# Patient Record
Sex: Female | Born: 1975 | Race: Black or African American | Hispanic: No | Smoking: Never smoker
Health system: Southern US, Community
[De-identification: ages and names within clinical notes are randomized; demographics above are authoritative.]

## PROBLEM LIST (undated history)

## (undated) HISTORY — PX: ANKLE SURGERY: SHX546

## (undated) HISTORY — PX: WISDOM TOOTH EXTRACTION: SHX21

## (undated) HISTORY — PX: FRACTURE SURGERY: SHX138

---

## 2002-11-19 HISTORY — PX: DILATION AND CURETTAGE OF UTERUS: SHX78

## 2005-06-20 ENCOUNTER — Emergency Department: Payer: Self-pay | Admitting: Emergency Medicine

## 2005-07-17 ENCOUNTER — Emergency Department: Payer: Self-pay | Admitting: Emergency Medicine

## 2007-02-23 ENCOUNTER — Emergency Department: Payer: Self-pay | Admitting: General Practice

## 2007-03-26 ENCOUNTER — Emergency Department: Payer: Self-pay | Admitting: Emergency Medicine

## 2007-08-21 ENCOUNTER — Emergency Department: Payer: Self-pay | Admitting: Emergency Medicine

## 2007-12-10 ENCOUNTER — Emergency Department: Payer: Self-pay | Admitting: Emergency Medicine

## 2008-02-10 ENCOUNTER — Emergency Department: Payer: Self-pay | Admitting: Internal Medicine

## 2008-08-17 ENCOUNTER — Other Ambulatory Visit: Payer: Self-pay

## 2008-08-17 ENCOUNTER — Emergency Department: Payer: Self-pay | Admitting: Emergency Medicine

## 2008-09-28 ENCOUNTER — Emergency Department: Payer: Self-pay | Admitting: Emergency Medicine

## 2010-02-13 ENCOUNTER — Emergency Department: Payer: Self-pay | Admitting: Internal Medicine

## 2010-06-16 ENCOUNTER — Emergency Department: Payer: Self-pay | Admitting: Emergency Medicine

## 2010-08-10 ENCOUNTER — Emergency Department: Payer: Self-pay | Admitting: Emergency Medicine

## 2010-10-29 ENCOUNTER — Emergency Department: Payer: Self-pay | Admitting: Unknown Physician Specialty

## 2010-11-02 ENCOUNTER — Emergency Department: Payer: Self-pay | Admitting: Unknown Physician Specialty

## 2010-11-12 ENCOUNTER — Emergency Department: Payer: Self-pay | Admitting: Emergency Medicine

## 2010-11-21 ENCOUNTER — Emergency Department: Payer: Self-pay | Admitting: Emergency Medicine

## 2010-12-26 ENCOUNTER — Emergency Department: Payer: Self-pay | Admitting: Internal Medicine

## 2010-12-27 ENCOUNTER — Emergency Department: Payer: Self-pay | Admitting: Emergency Medicine

## 2011-05-26 ENCOUNTER — Emergency Department: Payer: Self-pay | Admitting: Emergency Medicine

## 2012-04-06 ENCOUNTER — Emergency Department: Payer: Self-pay | Admitting: Emergency Medicine

## 2012-04-06 LAB — URINALYSIS, COMPLETE
Bacteria: NONE SEEN
Bilirubin,UR: NEGATIVE
Blood: NEGATIVE
Glucose,UR: NEGATIVE mg/dL (ref 0–75)
Ketone: NEGATIVE
Nitrite: NEGATIVE
Ph: 5 (ref 4.5–8.0)
Protein: NEGATIVE
RBC,UR: 4 /HPF (ref 0–5)
Specific Gravity: 1.023 (ref 1.003–1.030)
Squamous Epithelial: 5
WBC UR: 5 /HPF (ref 0–5)

## 2012-04-24 ENCOUNTER — Emergency Department: Payer: Self-pay | Admitting: *Deleted

## 2012-04-25 LAB — URINALYSIS, COMPLETE
Blood: NEGATIVE
Glucose,UR: NEGATIVE mg/dL (ref 0–75)
Nitrite: NEGATIVE
Protein: NEGATIVE
Specific Gravity: 1.024 (ref 1.003–1.030)
Squamous Epithelial: 23

## 2012-04-26 LAB — URINE CULTURE

## 2012-05-02 ENCOUNTER — Emergency Department: Payer: Self-pay | Admitting: Emergency Medicine

## 2012-05-03 LAB — URINALYSIS, COMPLETE
Blood: NEGATIVE
Nitrite: NEGATIVE
Ph: 5 (ref 4.5–8.0)
Protein: NEGATIVE
RBC,UR: 7 /HPF (ref 0–5)

## 2012-05-03 LAB — PREGNANCY, URINE: Pregnancy Test, Urine: NEGATIVE m[IU]/mL

## 2013-05-15 ENCOUNTER — Emergency Department: Payer: Self-pay | Admitting: Emergency Medicine

## 2013-05-15 LAB — COMPREHENSIVE METABOLIC PANEL
Albumin: 3.5 g/dL (ref 3.4–5.0)
Alkaline Phosphatase: 68 U/L (ref 50–136)
Anion Gap: 5 — ABNORMAL LOW (ref 7–16)
BUN: 10 mg/dL (ref 7–18)
Chloride: 106 mmol/L (ref 98–107)
Creatinine: 1.14 mg/dL (ref 0.60–1.30)
EGFR (African American): >60
Glucose: 70 mg/dL (ref 65–99)
Osmolality: 275 (ref 275–301)
Potassium: 3.9 mmol/L (ref 3.5–5.1)
SGOT(AST): 13 U/L — ABNORMAL LOW (ref 15–37)
SGPT (ALT): 18 U/L (ref 12–78)
Sodium: 139 mmol/L (ref 136–145)

## 2013-05-15 LAB — CBC
HCT: 40 % (ref 35.0–47.0)
MCH: 30 pg (ref 26.0–34.0)
MCV: 90 fL (ref 80–100)
Platelet: 264 10*3/uL (ref 150–440)
RDW: 12.9 % (ref 11.5–14.5)
WBC: 16 10*3/uL — ABNORMAL HIGH (ref 3.6–11.0)

## 2013-05-15 LAB — URINALYSIS, COMPLETE
Glucose,UR: NEGATIVE mg/dL (ref 0–75)
Nitrite: NEGATIVE
Ph: 7 (ref 4.5–8.0)
Squamous Epithelial: 2
WBC UR: 41 /HPF (ref 0–5)

## 2013-05-18 LAB — BETA STREP CULTURE(ARMC)

## 2013-06-25 ENCOUNTER — Emergency Department: Payer: Self-pay | Admitting: Emergency Medicine

## 2013-06-25 LAB — COMPREHENSIVE METABOLIC PANEL
Anion Gap: 5 — ABNORMAL LOW (ref 7–16)
Bilirubin,Total: 0.2 mg/dL (ref 0.2–1.0)
Calcium, Total: 9.4 mg/dL (ref 8.5–10.1)
EGFR (African American): >60
Glucose: 80 mg/dL (ref 65–99)
Osmolality: 277 (ref 275–301)
SGOT(AST): 18 U/L (ref 15–37)
SGPT (ALT): 17 U/L (ref 12–78)
Sodium: 139 mmol/L (ref 136–145)
Total Protein: 8.3 g/dL — ABNORMAL HIGH (ref 6.4–8.2)

## 2013-06-25 LAB — CBC
HCT: 39.4 % (ref 35.0–47.0)
MCV: 88 fL (ref 80–100)
RBC: 4.48 10*6/uL (ref 3.80–5.20)
WBC: 11.7 10*3/uL — ABNORMAL HIGH (ref 3.6–11.0)

## 2013-06-26 LAB — URINALYSIS, COMPLETE
Bilirubin,UR: NEGATIVE
Ketone: NEGATIVE
Ph: 5 (ref 4.5–8.0)
Protein: 30

## 2013-09-14 ENCOUNTER — Emergency Department: Payer: Self-pay | Admitting: Emergency Medicine

## 2013-09-14 LAB — URINALYSIS, COMPLETE
Glucose,UR: NEGATIVE mg/dL (ref 0–75)
Ph: 5 (ref 4.5–8.0)
Protein: 30
RBC,UR: 102 /HPF (ref 0–5)
Specific Gravity: 1.015 (ref 1.003–1.030)
Squamous Epithelial: 1
WBC UR: 1039 /HPF (ref 0–5)

## 2014-09-24 ENCOUNTER — Emergency Department: Payer: Self-pay | Admitting: Internal Medicine

## 2014-09-24 LAB — CBC WITH DIFFERENTIAL/PLATELET
Basophil #: 0.2 10*3/uL — ABNORMAL HIGH (ref 0.0–0.1)
Basophil %: 0.8 %
EOS PCT: 0.2 %
Eosinophil #: 0 10*3/uL (ref 0.0–0.7)
HCT: 43.3 % (ref 35.0–47.0)
HGB: 14.1 g/dL (ref 12.0–16.0)
LYMPHS ABS: 2.1 10*3/uL (ref 1.0–3.6)
Lymphocyte %: 11.6 %
MCH: 29.4 pg (ref 26.0–34.0)
MCHC: 32.6 g/dL (ref 32.0–36.0)
MCV: 90 fL (ref 80–100)
Monocyte #: 1.2 x10 3/mm — ABNORMAL HIGH (ref 0.2–0.9)
Monocyte %: 6.6 %
Neutrophil #: 14.9 10*3/uL — ABNORMAL HIGH (ref 1.4–6.5)
Neutrophil %: 80.8 %
Platelet: 265 10*3/uL (ref 150–440)
RBC: 4.8 10*6/uL (ref 3.80–5.20)
RDW: 13.6 % (ref 11.5–14.5)
WBC: 18.4 10*3/uL — ABNORMAL HIGH (ref 3.6–11.0)

## 2014-09-24 LAB — URINALYSIS, COMPLETE
Bilirubin,UR: NEGATIVE
Blood: NEGATIVE
GLUCOSE, UR: NEGATIVE mg/dL (ref 0–75)
Ketone: NEGATIVE
LEUKOCYTE ESTERASE: NEGATIVE
NITRITE: NEGATIVE
PROTEIN: NEGATIVE
Ph: 7 (ref 4.5–8.0)
RBC,UR: 1 /HPF (ref 0–5)
SPECIFIC GRAVITY: 1.012 (ref 1.003–1.030)
Squamous Epithelial: 5
WBC UR: 4 /HPF (ref 0–5)

## 2014-09-24 LAB — ED INFLUENZA
H1N1 flu by pcr: NOT DETECTED
Influenza A By PCR: NEGATIVE
Influenza B By PCR: NEGATIVE

## 2014-09-24 LAB — BASIC METABOLIC PANEL
Anion Gap: 6 — ABNORMAL LOW (ref 7–16)
BUN: 10 mg/dL (ref 7–18)
CO2: 26 mmol/L (ref 21–32)
Calcium, Total: 8.8 mg/dL (ref 8.5–10.1)
Chloride: 105 mmol/L (ref 98–107)
Creatinine: 0.94 mg/dL (ref 0.60–1.30)
EGFR (African American): >60
EGFR (Non-African Amer.): >60
Glucose: 75 mg/dL (ref 65–99)
Osmolality: 272 (ref 275–301)
Potassium: 4.6 mmol/L (ref 3.5–5.1)
Sodium: 137 mmol/L (ref 136–145)

## 2014-09-24 LAB — MONONUCLEOSIS SCREEN: Mono Test: NEGATIVE

## 2014-09-27 LAB — BETA STREP CULTURE(ARMC)

## 2014-11-18 ENCOUNTER — Emergency Department: Payer: Self-pay | Admitting: Emergency Medicine

## 2015-04-02 ENCOUNTER — Encounter: Payer: Self-pay | Admitting: Emergency Medicine

## 2015-04-02 ENCOUNTER — Emergency Department
Admission: EM | Admit: 2015-04-02 | Discharge: 2015-04-02 | Disposition: A | Discharge status: Home / Self Care | Payer: BLUE CROSS/BLUE SHIELD | Attending: Emergency Medicine | Admitting: Emergency Medicine

## 2015-04-02 DIAGNOSIS — R059 Cough, unspecified: Secondary | ICD-10-CM

## 2015-04-02 DIAGNOSIS — R05 Cough: Secondary | ICD-10-CM

## 2015-04-02 DIAGNOSIS — J0111 Acute recurrent frontal sinusitis: Secondary | ICD-10-CM | POA: Diagnosis not present

## 2015-04-02 DIAGNOSIS — R52 Pain, unspecified: Secondary | ICD-10-CM | POA: Diagnosis present

## 2015-04-02 MED ORDER — AMOXICILLIN-POT CLAVULANATE 875-125 MG PO TABS: 1.0000 | ORAL_TABLET | Freq: Two times a day (BID) | ORAL | Status: AC

## 2015-04-02 MED ORDER — IBUPROFEN 800 MG PO TABS: 800.0000 mg | ORAL_TABLET | Freq: Three times a day (TID) | ORAL | Status: DC | PRN

## 2015-04-02 MED ORDER — FLUCONAZOLE 150 MG PO TABS: 150.0000 mg | ORAL_TABLET | Freq: Every day | ORAL | Status: DC

## 2015-04-02 MED ORDER — LORATADINE-PSEUDOEPHEDRINE ER 5-120 MG PO TB12
1.0000 | ORAL_TABLET | Freq: Two times a day (BID) | ORAL | Status: DC
Start: 2015-04-02 — End: 2015-08-18

## 2015-04-02 MED ORDER — OXYMETAZOLINE HCL 0.05 % NA SOLN: 2.0000 | Freq: Two times a day (BID) | NASAL | Status: AC

## 2015-04-02 NOTE — ED Notes (Signed)
D/C instructions given and reviewed. Pt verbalized an understanding and has no additional questions or concerns at this time. Pt ambulating independently.

## 2015-04-02 NOTE — ED Notes (Signed)
Patient to ED with c/o generalized cold symptoms along with body aches and chills for about the last 2 weeks that are continuing to get worse.

## 2015-04-02 NOTE — ED Provider Notes (Signed)
CSN: [id number]     Arrival date & time 04/02/15  1532 History   None    Chief Complaint  Patient presents with  . URI  . Generalized Body Aches  . Chills     (Consider location/radiation/quality/duration/timing/severity/associated sxs/prior Treatment) HPI patient has a two-week worsening history of sinusitis with a history of the same said she's tried over-the-counter treatments with no success is here today for reevaluation rates it as a 8 out of 10 pressure-like pain around her eyes with drainage going down into her throat causing her to cough a lot nothing seemingly making it particularly better or worse she says she can feel the pressure increases when she leans forward denies any nausea vomiting or any other symptoms of note  History reviewed. No pertinent past medical history. History reviewed. No pertinent past surgical history. History reviewed. No pertinent family history. History  Substance Use Topics  . Smoking status: Never Smoker   . Smokeless tobacco: Never Used  . Alcohol Use: No   OB History    No data available     Review of Systems   Constitutional: No fever.  Baseline level of activity. Eyes: No visual changes.  No red eyes/discharge. ENT: No sore throat.  Not pulling at ears. Cardiovascular: Negative for chest pain/palpitations. Respiratory: Negative for shortness of breath. Gastrointestinal: No abdominal pain.  No nausea, no vomiting.  No diarrhea.  No constipation. Genitourinary: Negative for dysuria.  Normal urination. Musculoskeletal: Negative for back pain. Skin: Negative for rash. Neurological: Negative for headaches, focal weakness or numbness.  Negative 6 systems as best review the patient otherwise except noted in history of present illness    Allergies  Review of patient's allergies indicates no known allergies.  Home Medications   Prior to Admission medications   Medication Sig Start Date End Date Taking? Authorizing Provider   amoxicillin-clavulanate (AUGMENTIN) 875-125 MG per tablet Take 1 tablet by mouth every 12 (twelve) hours. 04/02/15 04/12/15  Prezley Qadir William C Abednego Yeates, PA-C  fluconazole (DIFLUCAN) 150 MG tablet Take 1 tablet (150 mg total) by mouth daily. 04/02/15   Chaos Carlile William C Cherrell Maybee, PA-C  ibuprofen (ADVIL,MOTRIN) 800 MG tablet Take 1 tablet (800 mg total) by mouth every 8 (eight) hours as needed. 04/02/15   Haylea Schlichting William C Alpha Mysliwiec, PA-C  loratadine-pseudoephedrine (CLARITIN-D 12 HOUR) 5-120 MG per tablet Take 1 tablet by mouth 2 (two) times daily. 04/02/15 04/01/16  Linnette Panella William C Devaeh Amadi, PA-C  oxymetazoline (AFRIN) 0.05 % nasal spray Place 2 sprays into both nostrils 2 (two) times daily. 04/02/15 04/06/15  Elford Evilsizer William C Thomson Herbers, PA-C   BP 119/88 mmHg  Pulse 95  Temp(Src) 97.8 F (36.6 C) (Oral)  Resp 22  Ht 5\' 2"  (1.575 m)  Wt 200 lb (90.719 kg)  BMI 36.57 kg/m2  SpO2 96%  LMP 03/27/2015 Physical Exam  Female appearing stated age well-developed well-nourished and in no acute distress vitals were reviewed from nursing notes Head ears eyes nose neck and throat exam reveals frontal and maxillary sinus tenderness thick green nasal discharge postnasal drainage +2 tonsils Neck hematologic no adenopathy Cardiovascular regular rate and rhythm no murmurs or gallops Pulmonary lungs auscultation bilaterally Skin appeared otherwise free of Rashs disease Neuro exam is nonfocal cranial nerves II through XII grossly intact  ED Course  Procedures       MDM  Decision making on this patient she is a patient with frontal and maxillary sinusitis she has a history of sinusitis states that she is tried over-the-counter  remedies is now developed fevers chills thick purulent nasal discharge and is here for evaluation no other complaints this time no nausea vomiting diarrhea has a cough which she believes is coming directly from her sinuses she will be started on antibiotics decongestants follow-up with ENT Final diagnoses:   Acute recurrent frontal sinusitis  Cough       Mary Hockey Verdene Rio, PA-C 04/02/15 [place], MD 04/02/15 1654

## 2015-04-02 NOTE — Discharge Instructions (Signed)
Take over the counter benadryl as needed to supplement

## 2015-05-30 ENCOUNTER — Encounter: Payer: Self-pay | Admitting: *Deleted

## 2015-05-30 ENCOUNTER — Emergency Department
Admission: EM | Admit: 2015-05-30 | Discharge: 2015-05-30 | Disposition: A | Discharge status: Home / Self Care | Payer: BLUE CROSS/BLUE SHIELD | Attending: Emergency Medicine | Admitting: Emergency Medicine

## 2015-05-30 DIAGNOSIS — R1084 Generalized abdominal pain: Secondary | ICD-10-CM | POA: Diagnosis present

## 2015-05-30 DIAGNOSIS — N898 Other specified noninflammatory disorders of vagina: Secondary | ICD-10-CM | POA: Insufficient documentation

## 2015-05-30 DIAGNOSIS — Z792 Long term (current) use of antibiotics: Secondary | ICD-10-CM | POA: Diagnosis not present

## 2015-05-30 DIAGNOSIS — N390 Urinary tract infection, site not specified: Secondary | ICD-10-CM | POA: Diagnosis not present

## 2015-05-30 DIAGNOSIS — Z3202 Encounter for pregnancy test, result negative: Secondary | ICD-10-CM | POA: Insufficient documentation

## 2015-05-30 DIAGNOSIS — Z79899 Other long term (current) drug therapy: Secondary | ICD-10-CM | POA: Insufficient documentation

## 2015-05-30 LAB — CBC WITH DIFFERENTIAL/PLATELET
Basophils Absolute: 0.1 10*3/uL (ref 0–0.1)
Basophils Relative: 1 %
Eosinophils Absolute: 0.1 10*3/uL (ref 0–0.7)
Eosinophils Relative: 1 %
HEMATOCRIT: 40.7 % (ref 35.0–47.0)
HEMOGLOBIN: 13.7 g/dL (ref 12.0–16.0)
LYMPHS PCT: 31 %
Lymphs Abs: 3.3 10*3/uL (ref 1.0–3.6)
MCH: 30.1 pg (ref 26.0–34.0)
MCHC: 33.7 g/dL (ref 32.0–36.0)
MCV: 89.2 fL (ref 80.0–100.0)
Monocytes Absolute: 0.5 10*3/uL (ref 0.2–0.9)
Monocytes Relative: 5 %
NEUTROS PCT: 62 %
Neutro Abs: 6.6 10*3/uL — ABNORMAL HIGH (ref 1.4–6.5)
PLATELETS: 267 10*3/uL (ref 150–440)
RBC: 4.56 MIL/uL (ref 3.80–5.20)
RDW: 13.9 % (ref 11.5–14.5)
WBC: 10.6 10*3/uL (ref 3.6–11.0)

## 2015-05-30 LAB — WET PREP, GENITAL
Clue Cells Wet Prep HPF POC: NONE SEEN
Trich, Wet Prep: NONE SEEN
YEAST WET PREP: NONE SEEN

## 2015-05-30 LAB — POCT PREGNANCY, URINE: Preg Test, Ur: NEGATIVE

## 2015-05-30 LAB — URINALYSIS COMPLETE WITH MICROSCOPIC (ARMC ONLY)
Bilirubin Urine: NEGATIVE
Glucose, UA: NEGATIVE mg/dL
Nitrite: NEGATIVE
PH: 5 (ref 5.0–8.0)
Protein, ur: NEGATIVE mg/dL
Specific Gravity, Urine: 1.027 (ref 1.005–1.030)

## 2015-05-30 LAB — BASIC METABOLIC PANEL
Anion gap: 9 (ref 5–15)
BUN: 13 mg/dL (ref 6–20)
CHLORIDE: 104 mmol/L (ref 101–111)
CO2: 28 mmol/L (ref 22–32)
Calcium: 9.6 mg/dL (ref 8.9–10.3)
Creatinine, Ser: 0.98 mg/dL (ref 0.44–1.00)
GFR calc Af Amer: >60 mL/min (ref >60)
GFR calc non Af Amer: >60 mL/min (ref >60)
Glucose, Bld: 129 mg/dL — ABNORMAL HIGH (ref 65–99)
POTASSIUM: 3.8 mmol/L (ref 3.5–5.1)
SODIUM: 141 mmol/L (ref 135–145)

## 2015-05-30 LAB — CHLAMYDIA/NGC RT PCR (ARMC ONLY)
Chlamydia Tr: NOT DETECTED
N gonorrhoeae: NOT DETECTED

## 2015-05-30 MED ORDER — CIPROFLOXACIN HCL 500 MG PO TABS: 500.0000 mg | ORAL_TABLET | Freq: Two times a day (BID) | ORAL | Status: AC

## 2015-05-30 NOTE — Discharge Instructions (Signed)

## 2015-05-30 NOTE — ED Provider Notes (Signed)
[facility] Emergency Department Provider Note  ____________________________________________  Time seen: On arrival  I have reviewed the triage vital signs and the nursing notes.   HISTORY  Chief Complaint Vaginal Discharge and Abdominal Pain    HPI Nicole Mcgee is a 39 y.o. female who complains of mild lower abdominal cramping and vaginal discharge for 3 days. She does report dysuria and is worried she may have a urinary tract infection as well. No fevers no chills. No nausea no vomiting. She has had urinary tract infections in the past and this feels similar. She does report unprotected intercourse recently     No past medical history on file.  There are no active problems to display for this patient.   No past surgical history on file.  Current Outpatient Rx  Name  Route  Sig  Dispense  Refill  . ciprofloxacin (CIPRO) 500 MG tablet   Oral   Take 1 tablet (500 mg total) by mouth 2 (two) times daily.   14 tablet   0   . fluconazole (DIFLUCAN) 150 MG tablet   Oral   Take 1 tablet (150 mg total) by mouth daily.   1 tablet   0   . ibuprofen (ADVIL,MOTRIN) 800 MG tablet   Oral   Take 1 tablet (800 mg total) by mouth every 8 (eight) hours as needed.   30 tablet   0   . loratadine-pseudoephedrine (CLARITIN-D 12 HOUR) 5-120 MG per tablet   Oral   Take 1 tablet by mouth 2 (two) times daily.   20 tablet   0     Allergies Review of patient's allergies indicates no known allergies.  No family history on file.  Social History History  Substance Use Topics  . Smoking status: Never Smoker   . Smokeless tobacco: Never Used  . Alcohol Use: No    Review of Systems  Constitutional: Negative for fever. Eyes: Negative for discharge ENT: Negative for sore throat Cardiovascular: Negative for chest pain. Respiratory: Negative for shortness of breath. Gastrointestinal: Negative for abdominal pain, vomiting and diarrhea. Genitourinary:  Positive for dysuria. Also for vaginal discharge Musculoskeletal: Negative for back pain. Skin: Negative for rash. Neurological: Negative for headaches or focal weakness Psychiatric: No anxiety  10-point ROS otherwise negative.  ____________________________________________   PHYSICAL EXAM:  VITAL SIGNS: ED Triage Vitals  Enc Vitals Group     BP 05/30/15 1604 135/85 mmHg     Pulse Rate 05/30/15 1604 76     Resp 05/30/15 1604 18     Temp 05/30/15 1604 98.2 F (36.8 C)     Temp Source 05/30/15 1604 Oral     SpO2 05/30/15 1604 99 %     Weight 05/30/15 1604 200 lb (90.719 kg)     Height 05/30/15 1604 5\' 2"  (1.575 m)     Head Cir --      Peak Flow --      Pain Score 05/30/15 1606 4     Pain Loc --      Pain Edu? --      Excl. in [place]? --      Constitutional: Alert and oriented. Well appearing and in no distress. Eyes: Conjunctivae are normal.  ENT   Head: Normocephalic and atraumatic.   Mouth/Throat: Mucous membranes are moist. Cardiovascular: Normal rate, regular rhythm. Normal and symmetric distal pulses are present in all extremities. No murmurs, rubs, or gallops. Respiratory: Normal respiratory effort without tachypnea nor retractions. Breath sounds are clear  and equal bilaterally.  Gastrointestinal: Soft and non-tender in all quadrants. No distention. There is no CVA tenderness. Genitourinary: Thin white discharge noted, no cervicitis, no cervical motion tenderness Musculoskeletal: Nontender with normal range of motion in all extremities. No lower extremity tenderness nor edema. Neurologic:  Normal speech and language. No gross focal neurologic deficits are appreciated. Skin:  Skin is warm, dry and intact. No rash noted. Psychiatric: Mood and affect are normal. Patient exhibits appropriate insight and judgment.  ____________________________________________    LABS (pertinent positives/negatives)  Labs Reviewed  WET PREP, GENITAL - Abnormal; Notable for the  following:    WBC, Wet Prep HPF POC FEW (*)    All other components within normal limits  CBC WITH DIFFERENTIAL/PLATELET - Abnormal; Notable for the following:    Neutro Abs 6.6 (*)    All other components within normal limits  BASIC METABOLIC PANEL - Abnormal; Notable for the following:    Glucose, Bld 129 (*)    All other components within normal limits  URINALYSIS COMPLETEWITH MICROSCOPIC ([facility] ONLY) - Abnormal; Notable for the following:    Color, Urine YELLOW (*)    APPearance HAZY (*)    Ketones, ur TRACE (*)    Hgb urine dipstick 1+ (*)    Leukocytes, UA 1+ (*)    Bacteria, UA RARE (*)    Squamous Epithelial / LPF 6-30 (*)    All other components within normal limits  CHLAMYDIA/NGC RT PCR ([facility] ONLY)  POC URINE PREG, ED  POCT PREGNANCY, URINE    ____________________________________________   EKG  None  ____________________________________________    RADIOLOGY I have personally reviewed any xrays that were ordered on this patient: None  ____________________________________________   PROCEDURES  Procedure(s) performed: none  Critical Care performed: none  ____________________________________________   INITIAL IMPRESSION / ASSESSMENT AND PLAN / ED COURSE  Pertinent labs & imaging results that were available during my care of the patient were reviewed by me and considered in my medical decision making (see chart for details).  Patient well-appearing. Labs benign. Negative gonorrhea negative Chlamydia. Pregnancy negative. Urine suspicious for early urinary tract infection. We will treat with antibiotics  ____________________________________________   FINAL CLINICAL IMPRESSION(S) / ED DIAGNOSES  Final diagnoses:  UTI (lower urinary tract infection)     Lavonia Drafts, MD 05/30/15 2031

## 2015-05-30 NOTE — ED Notes (Signed)
Pt has lower abd pain with vaginal discharge for 3 days.  No vag bleeding.  Pt has dysuria.  No low back pain.

## 2015-05-30 NOTE — ED Notes (Signed)
MD Kinner at bedside  

## 2015-07-16 ENCOUNTER — Emergency Department
Admission: EM | Admit: 2015-07-16 | Discharge: 2015-07-16 | Disposition: A | Discharge status: Home / Self Care | Payer: BLUE CROSS/BLUE SHIELD | Attending: Emergency Medicine | Admitting: Emergency Medicine

## 2015-07-16 ENCOUNTER — Emergency Department: Payer: BLUE CROSS/BLUE SHIELD

## 2015-07-16 ENCOUNTER — Encounter: Payer: Self-pay | Admitting: Emergency Medicine

## 2015-07-16 DIAGNOSIS — Z79899 Other long term (current) drug therapy: Secondary | ICD-10-CM | POA: Insufficient documentation

## 2015-07-16 DIAGNOSIS — J209 Acute bronchitis, unspecified: Secondary | ICD-10-CM | POA: Insufficient documentation

## 2015-07-16 MED ORDER — GUAIFENESIN-CODEINE 100-10 MG/5ML PO SOLN
10.0000 mL | Freq: Three times a day (TID) | ORAL | Status: DC | PRN
Start: 2015-07-16 — End: 2015-08-18

## 2015-07-16 MED ORDER — AZITHROMYCIN 250 MG PO TABS: ORAL_TABLET | ORAL | Status: DC

## 2015-07-16 NOTE — ED Provider Notes (Signed)
[facility] Emergency Department Provider Note ____________________________________________  Time seen: Approximately 9:56 AM  I have reviewed the triage vital signs and the nursing notes.   HISTORY  Chief Complaint Nasal Congestion and Cough   HPI Nicole Mcgee is a 39 y.o. female who presents to emergency department for evaluation of cough, and nasal congestion, fever, and chills for the past 2 weeks.  History reviewed. No pertinent past medical history.  There are no active problems to display for this patient.   History reviewed. No pertinent past surgical history.  Current Outpatient Rx  Name  Route  Sig  Dispense  Refill  . azithromycin (ZITHROMAX Z-PAK) 250 MG tablet      Take 2 tablets (500 mg) on  Day 1,  followed by 1 tablet (250 mg) once daily on Days 2 through 5.   6 each   0   . fluconazole (DIFLUCAN) 150 MG tablet   Oral   Take 1 tablet (150 mg total) by mouth daily.   1 tablet   0   . guaiFENesin-codeine 100-10 MG/5ML syrup   Oral   Take 10 mLs by mouth 3 (three) times daily as needed for cough.   120 mL   0   . ibuprofen (ADVIL,MOTRIN) 800 MG tablet   Oral   Take 1 tablet (800 mg total) by mouth every 8 (eight) hours as needed.   30 tablet   0   . loratadine-pseudoephedrine (CLARITIN-D 12 HOUR) 5-120 MG per tablet   Oral   Take 1 tablet by mouth 2 (two) times daily.   20 tablet   0     Allergies Review of patient's allergies indicates no known allergies.  History reviewed. No pertinent family history.  Social History Social History  Substance Use Topics  . Smoking status: Never Smoker   . Smokeless tobacco: Never Used  . Alcohol Use: No    Review of Systems Constitutional: Positive for fever/chills Eyes: No visual changes. ENT: No sore throat. Cardiovascular: Denies chest pain. Respiratory: Denies shortness of breath. Gastrointestinal: No abdominal pain.  No nausea, no vomiting.  No diarrhea.  No  constipation. Genitourinary: Negative for dysuria. Musculoskeletal: Negative for back pain. Skin: Negative for rash. Neurological: Negative for headaches, focal weakness or numbness.  10-point ROS otherwise negative.  ____________________________________________   PHYSICAL EXAM:  VITAL SIGNS: ED Triage Vitals  Enc Vitals Group     BP 07/16/15 0917 137/81 mmHg     Pulse Rate 07/16/15 0917 75     Resp --      Temp 07/16/15 0917 97.6 F (36.4 C)     Temp Source 07/16/15 0917 Oral     SpO2 07/16/15 0917 97 %     Weight 07/16/15 0917 200 lb (90.719 kg)     Height 07/16/15 0917 5\' 2"  (1.575 m)     Head Cir --      Peak Flow --      Pain Score 07/16/15 0921 5     Pain Loc --      Pain Edu? --      Excl. in [place]? --     Constitutional: Alert and oriented. Well appearing and in no acute distress. Eyes: Conjunctivae are normal. PERRL. EOMI. Head: Atraumatic. Nose: No congestion/rhinnorhea. Mouth/Throat: Mucous membranes are moist.  Oropharynx non-erythematous. Neck: No stridor.   Cardiovascular: Normal rate, regular rhythm. Grossly normal heart sounds.  Good peripheral circulation. Respiratory: Normal respiratory effort.  No retractions. Lungs CTAB. Gastrointestinal: Soft  and nontender. No distention. No abdominal bruits. No CVA tenderness. Musculoskeletal: No lower extremity tenderness nor edema.  No joint effusions. Neurologic:  Normal speech and language. No gross focal neurologic deficits are appreciated. No gait instability. Skin:  Skin is warm, dry and intact. No rash noted. Psychiatric: Mood and affect are normal. Speech and behavior are normal.  ____________________________________________   LABS (all labs ordered are listed, but only abnormal results are displayed)  Labs Reviewed - No data to display ____________________________________________  EKG   ____________________________________________  RADIOLOGY  Chest x-ray negative for acute cardiopulmonary  abnormalities.  I, Sherrie George, personally viewed and evaluated these images (plain radiographs) as part of my medical decision making.  ____________________________________________   PROCEDURES  Procedure(s) performed: None  Critical Care performed: No  ____________________________________________   INITIAL IMPRESSION / ASSESSMENT AND PLAN / ED COURSE  Pertinent labs & imaging results that were available during my care of the patient were reviewed by me and considered in my medical decision making (see chart for details) Patient was advised to follow-up with her primary care provider for symptoms that are not improving over the week. She was advised to return to the emergency department for symptoms that change or worsen if unable schedule an appointment_____________   FINAL CLINICAL IMPRESSION(S) / ED DIAGNOSES  Final diagnoses:  Acute bronchitis, unspecified organism      Victorino Dike, FNP 07/16/15 1131  Lisa Roca, MD 07/16/15 1359

## 2015-07-16 NOTE — Discharge Instructions (Signed)

## 2015-07-16 NOTE — ED Notes (Signed)
Reports cough, congestion, fever and chills off and on x 2 wks.  Mask applied.  No resp distress.

## 2015-08-18 ENCOUNTER — Encounter: Payer: Self-pay | Admitting: Emergency Medicine

## 2015-08-18 ENCOUNTER — Emergency Department
Admission: EM | Admit: 2015-08-18 | Discharge: 2015-08-18 | Disposition: A | Discharge status: Home / Self Care | Payer: BLUE CROSS/BLUE SHIELD | Attending: Student | Admitting: Student

## 2015-08-18 DIAGNOSIS — Y998 Other external cause status: Secondary | ICD-10-CM | POA: Insufficient documentation

## 2015-08-18 DIAGNOSIS — W57XXXA Bitten or stung by nonvenomous insect and other nonvenomous arthropods, initial encounter: Secondary | ICD-10-CM | POA: Insufficient documentation

## 2015-08-18 DIAGNOSIS — Y9289 Other specified places as the place of occurrence of the external cause: Secondary | ICD-10-CM | POA: Insufficient documentation

## 2015-08-18 DIAGNOSIS — S40862A Insect bite (nonvenomous) of left upper arm, initial encounter: Secondary | ICD-10-CM | POA: Insufficient documentation

## 2015-08-18 DIAGNOSIS — L089 Local infection of the skin and subcutaneous tissue, unspecified: Secondary | ICD-10-CM

## 2015-08-18 DIAGNOSIS — Y9389 Activity, other specified: Secondary | ICD-10-CM | POA: Insufficient documentation

## 2015-08-18 MED ORDER — FLUCONAZOLE 150 MG PO TABS: 150.0000 mg | ORAL_TABLET | Freq: Every day | ORAL | Status: DC

## 2015-08-18 MED ORDER — IBUPROFEN 800 MG PO TABS: 800.0000 mg | ORAL_TABLET | Freq: Three times a day (TID) | ORAL | Status: DC | PRN

## 2015-08-18 MED ORDER — SULFAMETHOXAZOLE-TRIMETHOPRIM 800-160 MG PO TABS: 1.0000 | ORAL_TABLET | Freq: Two times a day (BID) | ORAL | Status: DC

## 2015-08-18 NOTE — ED Notes (Signed)
Possible spider/insect bite to left elbow/forearm 2 days ago arm red and swollen

## 2015-08-18 NOTE — ED Provider Notes (Signed)
[facility] Emergency Department Provider Note  ____________________________________________  Time seen: Approximately 4:45 PM  I have reviewed the triage vital signs and the nursing notes.   HISTORY  Chief Complaint Insect Bite    HPI Nicole Mcgee is a 39 y.o. female who presents for evaluation of insect bite to the posterior aspect of her left upper arm 2 days. Patient reports that there is tenderness on causing increase tingling numbness sensation. Patient unsure of what type of insect bit her.   History reviewed. No pertinent past medical history.  There are no active problems to display for this patient.   History reviewed. No pertinent past surgical history.  Current Outpatient Rx  Name  Route  Sig  Dispense  Refill  . fluconazole (DIFLUCAN) 150 MG tablet   Oral   Take 1 tablet (150 mg total) by mouth daily.   1 tablet   0   . ibuprofen (ADVIL,MOTRIN) 800 MG tablet   Oral   Take 1 tablet (800 mg total) by mouth every 8 (eight) hours as needed.   30 tablet   0   . sulfamethoxazole-trimethoprim (BACTRIM DS,SEPTRA DS) 800-160 MG tablet   Oral   Take 1 tablet by mouth 2 (two) times daily.   20 tablet   0     Allergies Review of patient's allergies indicates no known allergies.  No family history on file.  Social History Social History  Substance Use Topics  . Smoking status: Never Smoker   . Smokeless tobacco: Never Used  . Alcohol Use: No    Review of Systems Constitutional: No fever/chills Eyes: No visual changes. ENT: No sore throat. Cardiovascular: Denies chest pain. Respiratory: Denies shortness of breath. Gastrointestinal: No abdominal pain.  No nausea, no vomiting.  No diarrhea.  No constipation. Genitourinary: Negative for dysuria. Musculoskeletal: Negative for back pain. Skin: Positive for rash left upper arm Neurological: Negative for headaches, focal weakness or numbness.  10-point ROS otherwise  negative.  ____________________________________________   PHYSICAL EXAM:  VITAL SIGNS: ED Triage Vitals  Enc Vitals Group     BP 08/18/15 1625 140/86 mmHg     Pulse Rate 08/18/15 1625 84     Resp 08/18/15 1625 20     Temp 08/18/15 1625 98.4 F (36.9 C)     Temp Source 08/18/15 1625 Oral     SpO2 08/18/15 1625 99 %     Weight 08/18/15 1625 200 lb (90.719 kg)     Height 08/18/15 1625 5\' 2"  (1.575 m)     Head Cir --      Peak Flow --      Pain Score 08/18/15 1621 6     Pain Loc --      Pain Edu? --      Excl. in [place]? --     Constitutional: Alert and oriented. Well appearing and in no acute distress. Eyes: Conjunctivae are normal. PERRL. EOMI. Head: Atraumatic. Nose: No congestion/rhinnorhea. Mouth/Throat: Mucous membranes are moist.  Oropharynx non-erythematous. Neck: No stridor.   Cardiovascular: Normal rate, regular rhythm. Grossly normal heart sounds.  Good peripheral circulation. Respiratory: Normal respiratory effort.  No retractions. Lungs CTAB. Musculoskeletal: No lower extremity tenderness nor edema.  No joint effusions. Neurologic:  Normal speech and language. No gross focal neurologic deficits are appreciated. No gait instability. Skin:  Skin is warm, dry and intact. Erythematous rash noted approximately 6 x 13 cm. Psychiatric: Mood and affect are normal. Speech and behavior are normal.  ____________________________________________  LABS (all labs ordered are listed, but only abnormal results are displayed)  Labs Reviewed - No data to display ____________________________________________    PROCEDURES  Procedure(s) performed: None  Critical Care performed: No  ____________________________________________   INITIAL IMPRESSION / ASSESSMENT AND PLAN / ED COURSE  Pertinent labs & imaging results that were available during my care of the patient were reviewed by me and considered in my medical decision making (see chart for details).  Insect bite left  upper arm. Rx given for Bactrim DS twice a day #20. Diflucan 150 mg by mouth 1 as needed. Return 800 mg 3 times a day as needed for pain and inflammation. She'll follow up with PCP or return to ER with any worsening symptomology.  Patient voices no other emergency medical complaints at this time. ____________________________________________   FINAL CLINICAL IMPRESSION(S) / ED DIAGNOSES  Final diagnoses:  Insect bite of arm, left, infected, initial encounter      Arlyss Repress, PA-C 08/18/15 1708  Joanne Gavel, MD 08/18/15 2159

## 2015-08-18 NOTE — Discharge Instructions (Signed)

## 2015-08-22 ENCOUNTER — Encounter: Payer: Self-pay | Admitting: Emergency Medicine

## 2015-08-22 ENCOUNTER — Emergency Department
Admission: EM | Admit: 2015-08-22 | Discharge: 2015-08-22 | Disposition: A | Discharge status: Home / Self Care | Payer: BLUE CROSS/BLUE SHIELD | Attending: Emergency Medicine | Admitting: Emergency Medicine

## 2015-08-22 DIAGNOSIS — Z792 Long term (current) use of antibiotics: Secondary | ICD-10-CM | POA: Insufficient documentation

## 2015-08-22 DIAGNOSIS — W57XXXA Bitten or stung by nonvenomous insect and other nonvenomous arthropods, initial encounter: Secondary | ICD-10-CM | POA: Insufficient documentation

## 2015-08-22 DIAGNOSIS — Z79899 Other long term (current) drug therapy: Secondary | ICD-10-CM | POA: Insufficient documentation

## 2015-08-22 DIAGNOSIS — S1096XA Insect bite of unspecified part of neck, initial encounter: Secondary | ICD-10-CM | POA: Insufficient documentation

## 2015-08-22 DIAGNOSIS — Y9289 Other specified places as the place of occurrence of the external cause: Secondary | ICD-10-CM | POA: Insufficient documentation

## 2015-08-22 DIAGNOSIS — Y998 Other external cause status: Secondary | ICD-10-CM | POA: Insufficient documentation

## 2015-08-22 DIAGNOSIS — S40862A Insect bite (nonvenomous) of left upper arm, initial encounter: Secondary | ICD-10-CM | POA: Insufficient documentation

## 2015-08-22 DIAGNOSIS — Y9389 Activity, other specified: Secondary | ICD-10-CM | POA: Insufficient documentation

## 2015-08-22 DIAGNOSIS — S40861A Insect bite (nonvenomous) of right upper arm, initial encounter: Secondary | ICD-10-CM | POA: Insufficient documentation

## 2015-08-22 MED ORDER — HYDROCORTISONE 0.5 % EX CREA: 1.0000 "application " | TOPICAL_CREAM | Freq: Two times a day (BID) | CUTANEOUS | Status: DC

## 2015-08-22 NOTE — ED Provider Notes (Signed)
[facility] Emergency Department Provider Note  ____________________________________________  Time seen: Approximately 10:09 AM  I have reviewed the triage vital signs and the nursing notes.   HISTORY  Chief Complaint Rash    HPI Nicole Mcgee is a 39 y.o. female who was seen here 3 days ago for insect bites. States that the plates to her arm neck and other arm are still present. Doesn't exhibit any better despite antibiotics. Patient denies any fever chills.   History reviewed. No pertinent past medical history.  There are no active problems to display for this patient.   History reviewed. No pertinent past surgical history.  Current Outpatient Rx  Name  Route  Sig  Dispense  Refill  . fluconazole (DIFLUCAN) 150 MG tablet   Oral   Take 1 tablet (150 mg total) by mouth daily.   1 tablet   0   . hydrocortisone cream 0.5 %   Topical   Apply 1 application topically 2 (two) times daily.   30 g   0   . ibuprofen (ADVIL,MOTRIN) 800 MG tablet   Oral   Take 1 tablet (800 mg total) by mouth every 8 (eight) hours as needed.   30 tablet   0   . sulfamethoxazole-trimethoprim (BACTRIM DS,SEPTRA DS) 800-160 MG tablet   Oral   Take 1 tablet by mouth 2 (two) times daily.   20 tablet   0     Allergies Review of patient's allergies indicates no known allergies.  History reviewed. No pertinent family history.  Social History Social History  Substance Use Topics  . Smoking status: Never Smoker   . Smokeless tobacco: Never Used  . Alcohol Use: No    Review of Systems Constitutional: No fever/chills Eyes: No visual changes. ENT: No sore throat. Cardiovascular: Denies chest pain. Respiratory: Denies shortness of breath. Gastrointestinal: No abdominal pain.  No nausea, no vomiting.  No diarrhea.  No constipation. Genitourinary: Negative for dysuria. Musculoskeletal: Negative for back pain. Skin: Positive for insect bites noted on the  posterior aspect of left upper arm 1 insect bites posterior aspect of the neck and several to the right arm as well Neurological: Negative for headaches, focal weakness or numbness.  10-point ROS otherwise negative.  ____________________________________________   PHYSICAL EXAM:  VITAL SIGNS: ED Triage Vitals  Enc Vitals Group     BP --      Pulse --      Resp --      Temp --      Temp src --      SpO2 --      Weight --      Height --      Head Cir --      Peak Flow --      Pain Score --      Pain Loc --      Pain Edu? --      Excl. in [place]? --     Constitutional: Alert and oriented. Well appearing and in no acute distress. Neurologic:  Normal speech and language. No gross focal neurologic deficits are appreciated. No gait instability. Skin:  Skin is warm, dry and intact. No rash noted. Psychiatric: Mood and affect are normal. Speech and behavior are normal.  ____________________________________________   LABS (all labs ordered are listed, but only abnormal results are displayed)  Labs Reviewed  WOUND CULTURE    PROCEDURES  Procedure(s) performed: None  Critical Care performed: No  ____________________________________________   INITIAL  IMPRESSION / ASSESSMENT AND PLAN / ED COURSE  Pertinent labs & imaging results that were available during my care of the patient were reviewed by me and considered in my medical decision making (see chart for details).  Multiple insect bites treated with Bactrim DS twice a day last week. Wound cultures obtained still continue current medication hydrocortisone cream added patient follow-up with PCP or return to ER with any worsening symptomology. ____________________________________________   FINAL CLINICAL IMPRESSION(S) / ED DIAGNOSES  Final diagnoses:  Insect bites      Arlyss Repress, PA-C 08/22/15 1514  Lavonia Drafts, MD 08/22/15 1536

## 2015-08-22 NOTE — ED Notes (Signed)
Pt states she was seen here on Friday and dx with insect bite.  States she has bites to arms and has been taking bactrim but not better

## 2015-08-22 NOTE — Discharge Instructions (Signed)
Insect Bite Mosquitoes, flies, fleas, bedbugs, and other insects can bite. Insect bites are different from insect stings. The bite may be red, puffy (swollen), and itchy for 2 to 4 days. Most bites get better on their own. HOME CARE   Do not scratch the bite.  Keep the bite clean and dry. Wash the bite with soap and water.  Put ice on the bite.  Put ice in a plastic bag.  Place a towel between your skin and the bag.  Leave the ice on for 20 minutes, 4 times a day. Do this for the first 2 to 3 days, or as told by your doctor.  You may use medicated lotions or creams to lessen itching as told by your doctor.  Only take medicines as told by your doctor.  If you are given medicines (antibiotics), take them as told. Finish them even if you start to feel better. You may need a tetanus shot if:  You cannot remember when you had your last tetanus shot.  You have never had a tetanus shot.  The injury broke your skin. If you need a tetanus shot and you choose not to have one, you may get tetanus. Sickness from tetanus can be serious. GET HELP RIGHT AWAY IF:   You have more pain, redness, or puffiness.  You see a red line on the skin coming from the bite.  You have a fever.  You have joint pain.  You have a headache or neck pain.  You feel weak.  You have a rash.  You have chest pain, or you are short of breath.  You have belly (abdominal) pain.  You feel sick to your stomach (nauseous) or throw up (vomit).  You feel very tired or sleepy. MAKE SURE YOU:   Understand these instructions.  Will watch your condition.  Will get help right away if you are not doing well or get worse. Document Released: 11/02/2000 Document Revised: 01/28/2012 Document Reviewed: 06/06/2011 Exit[employer] Patient Information 2015 [employer]. This information is not intended to replace advice given to you by your health care provider. Make sure you discuss any questions you have with your health  care provider.  

## 2015-08-22 NOTE — ED Notes (Signed)
Pt discharged home after verbalizing understanding of discharge instructions; nad noted. 

## 2015-08-26 LAB — WOUND CULTURE: CULTURE: NO GROWTH

## 2015-12-19 ENCOUNTER — Emergency Department
Admission: EM | Admit: 2015-12-19 | Discharge: 2015-12-19 | Disposition: A | Discharge status: Home / Self Care | Payer: BLUE CROSS/BLUE SHIELD | Attending: Emergency Medicine | Admitting: Emergency Medicine

## 2015-12-19 DIAGNOSIS — M545 Low back pain, unspecified: Secondary | ICD-10-CM

## 2015-12-19 DIAGNOSIS — Z7952 Long term (current) use of systemic steroids: Secondary | ICD-10-CM | POA: Insufficient documentation

## 2015-12-19 DIAGNOSIS — N390 Urinary tract infection, site not specified: Secondary | ICD-10-CM | POA: Diagnosis not present

## 2015-12-19 DIAGNOSIS — Z3202 Encounter for pregnancy test, result negative: Secondary | ICD-10-CM | POA: Insufficient documentation

## 2015-12-19 LAB — URINALYSIS COMPLETE WITH MICROSCOPIC (ARMC ONLY)
BILIRUBIN URINE: NEGATIVE
Glucose, UA: NEGATIVE mg/dL
Hgb urine dipstick: NEGATIVE
KETONES UR: NEGATIVE mg/dL
NITRITE: POSITIVE — AB
PROTEIN: NEGATIVE mg/dL
SPECIFIC GRAVITY, URINE: 1.012 (ref 1.005–1.030)
pH: 5 (ref 5.0–8.0)

## 2015-12-19 LAB — POCT PREGNANCY, URINE: Preg Test, Ur: NEGATIVE

## 2015-12-19 MED ORDER — SULFAMETHOXAZOLE-TRIMETHOPRIM 800-160 MG PO TABS
1.0000 | ORAL_TABLET | Freq: Once | ORAL | Status: AC
  Administered 2015-12-19: 1 via ORAL

## 2015-12-19 MED ORDER — CYCLOBENZAPRINE HCL 5 MG PO TABS: 5.0000 mg | ORAL_TABLET | Freq: Three times a day (TID) | ORAL | Status: DC | PRN

## 2015-12-19 MED ORDER — NAPROXEN 500 MG PO TBEC: 500.0000 mg | DELAYED_RELEASE_TABLET | Freq: Two times a day (BID) | ORAL | Status: DC

## 2015-12-19 MED ORDER — FLUCONAZOLE 150 MG PO TABS: 150.0000 mg | ORAL_TABLET | Freq: Once | ORAL | Status: DC

## 2015-12-19 MED ORDER — SULFAMETHOXAZOLE-TRIMETHOPRIM 800-160 MG PO TABS: 1.0000 | ORAL_TABLET | Freq: Two times a day (BID) | ORAL | Status: DC

## 2015-12-19 MED ORDER — SULFAMETHOXAZOLE-TRIMETHOPRIM 800-160 MG PO TABS
ORAL_TABLET | ORAL | Status: AC
  Filled 2015-12-19: qty 1

## 2015-12-19 NOTE — ED Notes (Signed)
Back spasms to middle back and going down to buttocks

## 2015-12-19 NOTE — Discharge Instructions (Signed)
Back Pain, Adult °Back pain is very common in adults. The cause of back pain is rarely dangerous and the pain often gets better over time. The cause of your back pain may not be known. Some common causes of back pain include: °· Strain of the muscles or ligaments supporting the spine. °· Wear and tear (degeneration) of the spinal disks. °· Arthritis. °· Direct injury to the back. °For many people, back pain may return. Since back pain is rarely dangerous, most people can learn to manage this condition on their own. °HOME CARE INSTRUCTIONS °Watch your back pain for any changes. The following actions may help to lessen any discomfort you are feeling: °· Remain active. It is stressful on your back to sit or stand in one place for long periods of time. Do not sit, drive, or stand in one place for more than 30 minutes at a time. Take short walks on even surfaces as soon as you are able. Try to increase the length of time you walk each day. °· Exercise regularly as directed by your health care provider. Exercise helps your back heal faster. It also helps avoid future injury by keeping your muscles strong and flexible. °· Do not stay in bed. Resting more than 1-2 days can delay your recovery. °· Pay attention to your body when you bend and lift. The most comfortable positions are those that put less stress on your recovering back. Always use proper lifting techniques, including: °· Bending your knees. °· Keeping the load close to your body. °· Avoiding twisting. °· Find a comfortable position to sleep. Use a firm mattress and lie on your side with your knees slightly bent. If you lie on your back, put a pillow under your knees. °· Avoid feeling anxious or stressed. Stress increases muscle tension and can worsen back pain. It is important to recognize when you are anxious or stressed and learn ways to manage it, such as with exercise. °· Take medicines only as directed by your health care provider. Over-the-counter  medicines to reduce pain and inflammation are often the most helpful. Your health care provider may prescribe muscle relaxant drugs. These medicines help dull your pain so you can more quickly return to your normal activities and healthy exercise. °· Apply ice to the injured area: °· Put ice in a plastic bag. °· Place a towel between your skin and the bag. °· Leave the ice on for 20 minutes, 2-3 times a day for the first 2-3 days. After that, ice and heat may be alternated to reduce pain and spasms. °· Maintain a healthy weight. Excess weight puts extra stress on your back and makes it difficult to maintain good posture. °SEEK MEDICAL CARE IF: °· You have pain that is not relieved with rest or medicine. °· You have increasing pain going down into the legs or buttocks. °· You have pain that does not improve in one week. °· You have night pain. °· You lose weight. °· You have a fever or chills. °SEEK IMMEDIATE MEDICAL CARE IF:  °· You develop new bowel or bladder control problems. °· You have unusual weakness or numbness in your arms or legs. °· You develop nausea or vomiting. °· You develop abdominal pain. °· You feel faint. °  °This information is not intended to replace advice given to you by your health care provider. Make sure you discuss any questions you have with your health care provider. °  °Document Released: 11/05/2005 Document Revised: 11/26/2014 Document Reviewed: 03/09/2014 °Elsevier Interactive Patient Education ©2016 Elsevier   Inc.  Urinary Tract Infection A urinary tract infection (UTI) can occur any place along the urinary tract. The tract includes the kidneys, ureters, bladder, and urethra. A type of germ called bacteria often causes a UTI. UTIs are often helped with antibiotic medicine.  HOME CARE   If given, take antibiotics as told by your doctor. Finish them even if you start to feel better.  Drink enough fluids to keep your pee (urine) clear or pale yellow.  Avoid tea, drinks with  caffeine, and bubbly (carbonated) drinks.  Pee often. Avoid holding your pee in for a long time.  Pee before and after having sex (intercourse).  Wipe from front to back after you poop (bowel movement) if you are a woman. Use each tissue only once. GET HELP RIGHT AWAY IF:   You have back pain.  You have lower belly (abdominal) pain.  You have chills.  You feel sick to your stomach (nauseous).  You throw up (vomit).  Your burning or discomfort with peeing does not go away.  You have a fever.  Your symptoms are not better in 3 days. MAKE SURE YOU:   Understand these instructions.  Will watch your condition.  Will get help right away if you are not doing well or get worse.   This information is not intended to replace advice given to you by your health care provider. Make sure you discuss any questions you have with your health care provider.   Document Released: 04/23/2008 Document Revised: 11/26/2014 Document Reviewed: 06/05/2012 Elsevier Interactive Patient Education 2016 Comfort the antibiotic as directed. Take the other meds as needed. Increase fluid intake to promote regular bathroom breaks. Follow-up with your provider for retest after treatment.

## 2015-12-21 NOTE — ED Provider Notes (Signed)
[facility] Emergency Department Provider Note ____________________________________________  Time seen: 1750  I have reviewed the triage vital signs and the nursing notes.  HISTORY  Chief Complaint  Spasms  HPI Nicole Mcgee is a 40 y.o. female presents to the ED for evaluation of 2 day complaint of low back pain and spasms. She denies any injury, trauma or increased activities. She dosed essentially back to work on vacation for the last week. She describes a grabbing pain to the low back denies any distal paresthesias, foot drop, or incontinence of bladder or bowel. She does note some intermittent dysuria, as well as some urgency incontinence. She dosed tramadol without much benefit to her symptoms. She is not dosed any over-the-counter anti-inflammatories however. She does also deny any vaginal discharge, itching, flank pain, or hematuria. She has noted some subjective fevers and admits to some chills intermittently. She rates her overall discomfort a 4/10 in triage.  History reviewed. No pertinent past medical history.  There are no active problems to display for this patient.   History reviewed. No pertinent past surgical history.  Current Outpatient Rx  Name  Route  Sig  Dispense  Refill  . cyclobenzaprine (FLEXERIL) 5 MG tablet   Oral   Take 1 tablet (5 mg total) by mouth 3 (three) times daily as needed for muscle spasms.   15 tablet   0   . fluconazole (DIFLUCAN) 150 MG tablet   Oral   Take 1 tablet (150 mg total) by mouth once. May repeat in 1 week.   2 tablet   0   . hydrocortisone cream 0.5 %   Topical   Apply 1 application topically 2 (two) times daily.   30 g   0   . ibuprofen (ADVIL,MOTRIN) 800 MG tablet   Oral   Take 1 tablet (800 mg total) by mouth every 8 (eight) hours as needed.   30 tablet   0   . naproxen (EC NAPROSYN) 500 MG EC tablet   Oral   Take 1 tablet (500 mg total) by mouth 2 (two) times daily with a meal.   30  tablet   0   . sulfamethoxazole-trimethoprim (BACTRIM DS,SEPTRA DS) 800-160 MG tablet   Oral   Take 1 tablet by mouth 2 (two) times daily.   10 tablet   0    Allergies Review of patient's allergies indicates no known allergies.  No family history on file.  Social History Social History  Substance Use Topics  . Smoking status: Never Smoker   . Smokeless tobacco: Never Used  . Alcohol Use: No   Review of Systems  Constitutional: Negative for fever. Eyes: Negative for visual changes. ENT: Negative for sore throat. Cardiovascular: Negative for chest pain. Respiratory: Negative for shortness of breath. Gastrointestinal: Negative for abdominal pain, vomiting and diarrhea. Genitourinary: Negative for dysuria. Musculoskeletal: Positive for back pain. Skin: Negative for rash. Neurological: Negative for headaches, focal weakness or numbness. ____________________________________________  PHYSICAL EXAM:  VITAL SIGNS: ED Triage Vitals  Enc Vitals Group     BP 12/19/15 1719 134/79 mmHg     Pulse Rate 12/19/15 1719 85     Resp 12/19/15 1719 16     Temp 12/19/15 1719 99.4 F (37.4 C)     Temp Source 12/19/15 1719 Oral     SpO2 12/19/15 1719 98 %     Weight 12/19/15 1719 200 lb (90.719 kg)     Height 12/19/15 1719 5\' 2"  (1.575 m)  Head Cir --      Peak Flow --      Pain Score 12/19/15 1839 4     Pain Loc --      Pain Edu? --      Excl. in [place]? --    Constitutional: Alert and oriented. Well appearing and in no distress. Head: Normocephalic and atraumatic.      Eyes: Conjunctivae are normal. PERRL. Normal extraocular movements      Ears: Canals clear. TMs intact bilaterally.   Nose: No congestion/rhinorrhea.   Mouth/Throat: Mucous membranes are moist.   Neck: Supple. No thyromegaly. Hematological/Lymphatic/Immunological: No cervical lymphadenopathy. Cardiovascular: Normal rate, regular rhythm.  Respiratory: Normal respiratory effort. No  wheezes/rales/rhonchi. Gastrointestinal: Soft and nontender. No distention. Musculoskeletal: Nontender with normal range of motion in all extremities.  Neurologic:  Normal gait without ataxia. Normal speech and language. No gross focal neurologic deficits are appreciated. Skin:  Skin is warm, dry and intact. No rash noted. Psychiatric: Mood and affect are normal. Patient exhibits appropriate insight and judgment. ____________________________________________   LABS (pertinent positives/negatives) Labs Reviewed  URINALYSIS COMPLETEWITH MICROSCOPIC ([facility] ONLY) - Abnormal; Notable for the following:    Color, Urine YELLOW (*)    APPearance CLOUDY (*)    Nitrite POSITIVE (*)    Leukocytes, UA 3+ (*)    Bacteria, UA RARE (*)    Squamous Epithelial / LPF 0-5 (*)    All other components within normal limits  URINE CULTURE  POCT PREGNANCY, URINE  ____________________________________________  PROCEDURES  Bactrim DS PO ____________________________________________  INITIAL IMPRESSION / ASSESSMENT AND PLAN / ED COURSE  Patient with low back pain and spasms as well as a clinically confirmed UTI. She will be discharged with prescriptions for Bactrim, Diflucan, Flexeril and Naprosyn to dose as directed. She is encouraged to increase fluid intake and empty her bladder regularly. She is followed with a primary care provider for test of cure in about 2 weeks. Patient is to return to the ED for acutely worsening symptoms as discussed. ____________________________________________  FINAL CLINICAL IMPRESSION(S) / ED DIAGNOSES  Final diagnoses:  UTI (lower urinary tract infection)  Bilateral low back pain without sciatica      Melvenia Needles, PA-C 12/21/15 0105  Delman Kitten, MD 12/24/15 0041

## 2015-12-22 LAB — URINE CULTURE
Culture: >=100000
SPECIAL REQUESTS: NORMAL

## 2015-12-24 ENCOUNTER — Emergency Department
Admission: EM | Admit: 2015-12-24 | Discharge: 2015-12-24 | Disposition: A | Discharge status: Home / Self Care | Payer: BLUE CROSS/BLUE SHIELD | Attending: Emergency Medicine | Admitting: Emergency Medicine

## 2015-12-24 ENCOUNTER — Encounter: Payer: Self-pay | Admitting: Emergency Medicine

## 2015-12-24 DIAGNOSIS — Z792 Long term (current) use of antibiotics: Secondary | ICD-10-CM | POA: Insufficient documentation

## 2015-12-24 DIAGNOSIS — Z7952 Long term (current) use of systemic steroids: Secondary | ICD-10-CM | POA: Diagnosis not present

## 2015-12-24 DIAGNOSIS — M545 Low back pain, unspecified: Secondary | ICD-10-CM

## 2015-12-24 DIAGNOSIS — Z791 Long term (current) use of non-steroidal anti-inflammatories (NSAID): Secondary | ICD-10-CM | POA: Diagnosis not present

## 2015-12-24 DIAGNOSIS — N390 Urinary tract infection, site not specified: Secondary | ICD-10-CM

## 2015-12-24 DIAGNOSIS — K5900 Constipation, unspecified: Secondary | ICD-10-CM | POA: Insufficient documentation

## 2015-12-24 DIAGNOSIS — Z79899 Other long term (current) drug therapy: Secondary | ICD-10-CM | POA: Diagnosis not present

## 2015-12-24 LAB — URINALYSIS COMPLETE WITH MICROSCOPIC (ARMC ONLY)
Bilirubin Urine: NEGATIVE
Glucose, UA: NEGATIVE mg/dL
Ketones, ur: NEGATIVE mg/dL
Nitrite: POSITIVE — AB
PH: 5 (ref 5.0–8.0)
Protein, ur: 30 mg/dL — AB
Specific Gravity, Urine: 1.016 (ref 1.005–1.030)

## 2015-12-24 MED ORDER — LIDOCAINE HCL (PF) 1 % IJ SOLN
5.0000 mL | Freq: Once | INTRAMUSCULAR | Status: AC
  Administered 2015-12-24: 5 mL

## 2015-12-24 MED ORDER — LIDOCAINE HCL (PF) 1 % IJ SOLN
INTRAMUSCULAR | Status: AC
  Administered 2015-12-24: 5 mL
  Filled 2015-12-24: qty 5

## 2015-12-24 MED ORDER — CIPROFLOXACIN HCL 500 MG PO TABS: 500.0000 mg | ORAL_TABLET | Freq: Two times a day (BID) | ORAL | Status: AC

## 2015-12-24 MED ORDER — HYDROCODONE-ACETAMINOPHEN 5-325 MG PO TABS: 1.0000 | ORAL_TABLET | ORAL | Status: DC | PRN

## 2015-12-24 MED ORDER — CEFTRIAXONE SODIUM 1 G IJ SOLR
1.0000 g | Freq: Once | INTRAMUSCULAR | Status: AC
  Administered 2015-12-24: 1 g via INTRAMUSCULAR
  Filled 2015-12-24: qty 10

## 2015-12-24 NOTE — ED Notes (Signed)
Patient presents to the ED with lower back pain.  Patient states she was in the ED about 1 week ago and was diagnosed with a UTI and was put on antibiotics.  Patient states her back pain is not improving.  Pain is worse after patient is standing for long periods of time.  Patient is in no obvious distress at this time.

## 2015-12-24 NOTE — Discharge Instructions (Signed)
Urinary Tract Infection Urinary tract infections (UTIs) can develop anywhere along your urinary tract. Your urinary tract is your body's drainage system for removing wastes and extra water. Your urinary tract includes two kidneys, two ureters, a bladder, and a urethra. Your kidneys are a pair of bean-shaped organs. Each kidney is about the size of your fist. They are located below your ribs, one on each side of your spine. CAUSES Infections are caused by microbes, which are microscopic organisms, including fungi, viruses, and bacteria. These organisms are so small that they can only be seen through a microscope. Bacteria are the microbes that most commonly cause UTIs. SYMPTOMS  Symptoms of UTIs may vary by age and gender of the patient and by the location of the infection. Symptoms in young women typically include a frequent and intense urge to urinate and a painful, burning feeling in the bladder or urethra during urination. Older women and men are more likely to be tired, shaky, and weak and have muscle aches and abdominal pain. A fever may mean the infection is in your kidneys. Other symptoms of a kidney infection include pain in your back or sides below the ribs, nausea, and vomiting. DIAGNOSIS To diagnose a UTI, your caregiver will ask you about your symptoms. Your caregiver will also ask you to provide a urine sample. The urine sample will be tested for bacteria and white blood cells. White blood cells are made by your body to help fight infection. TREATMENT  Typically, UTIs can be treated with medication. Because most UTIs are caused by a bacterial infection, they usually can be treated with the use of antibiotics. The choice of antibiotic and length of treatment depend on your symptoms and the type of bacteria causing your infection. HOME CARE INSTRUCTIONS  If you were prescribed antibiotics, take them exactly as your caregiver instructs you. Finish the medication even if you feel better after  you have only taken some of the medication.  Drink enough water and fluids to keep your urine clear or pale yellow.  Avoid caffeine, tea, and carbonated beverages. They tend to irritate your bladder.  Empty your bladder often. Avoid holding urine for long periods of time.  Empty your bladder before and after sexual intercourse.  After a bowel movement, women should cleanse from front to back. Use each tissue only once. SEEK MEDICAL CARE IF:   You have back pain.  You develop a fever.  Your symptoms do not begin to resolve within 3 days. SEEK IMMEDIATE MEDICAL CARE IF:   You have severe back pain or lower abdominal pain.  You develop chills.  You have nausea or vomiting.  You have continued burning or discomfort with urination. MAKE SURE YOU:   Understand these instructions.  Will watch your condition.  Will get help right away if you are not doing well or get worse.   This information is not intended to replace advice given to you by your health care provider. Make sure you discuss any questions you have with your health care provider.   Document Released: 08/15/2005 Document Revised: 07/27/2015 Document Reviewed: 12/14/2011 Elsevier Interactive Patient Education 2016 [employer].   Take antibiotic as directed. I would expect symptoms to significantly improve over the next couple days. If any worsening should return to the emergency room. Especially if you develop nausea and vomiting, and are unable to take your antibiotics.

## 2015-12-24 NOTE — ED Provider Notes (Signed)
[facility] Emergency Department Provider Note  ____________________________________________  Time seen: Approximately 7:03 PM  I have reviewed the triage vital signs and the nursing notes.   HISTORY  Chief Complaint Back Pain    HPI Nicole Mcgee is a 40 y.o. female who was seen last week for low back pain and UTI. She was started on Bactrim. However her symptoms have persisted with occasional incontinence. Still having low back pain, and occasional fevers and chills. No nausea or vomiting. Mild abdominal pain and constipation. She is taking by mouth fluids well.   History reviewed. No pertinent past medical history.  There are no active problems to display for this patient.   History reviewed. No pertinent past surgical history.  Current Outpatient Rx  Name  Route  Sig  Dispense  Refill  . ciprofloxacin (CIPRO) 500 MG tablet   Oral   Take 1 tablet (500 mg total) by mouth 2 (two) times daily.   14 tablet   0   . cyclobenzaprine (FLEXERIL) 5 MG tablet   Oral   Take 1 tablet (5 mg total) by mouth 3 (three) times daily as needed for muscle spasms.   15 tablet   0   . fluconazole (DIFLUCAN) 150 MG tablet   Oral   Take 1 tablet (150 mg total) by mouth once. May repeat in 1 week.   2 tablet   0   . HYDROcodone-acetaminophen (NORCO) 5-325 MG tablet   Oral   Take 1 tablet by mouth every 4 (four) hours as needed for moderate pain.   6 tablet   0   . hydrocortisone cream 0.5 %   Topical   Apply 1 application topically 2 (two) times daily.   30 g   0   . ibuprofen (ADVIL,MOTRIN) 800 MG tablet   Oral   Take 1 tablet (800 mg total) by mouth every 8 (eight) hours as needed.   30 tablet   0   . naproxen (EC NAPROSYN) 500 MG EC tablet   Oral   Take 1 tablet (500 mg total) by mouth 2 (two) times daily with a meal.   30 tablet   0   . sulfamethoxazole-trimethoprim (BACTRIM DS,SEPTRA DS) 800-160 MG tablet   Oral   Take 1 tablet by  mouth 2 (two) times daily.   10 tablet   0     Allergies Review of patient's allergies indicates no known allergies.  No family history on file.  Social History Social History  Substance Use Topics  . Smoking status: Never Smoker   . Smokeless tobacco: Never Used  . Alcohol Use: No    Review of Systems Constitutional: per HPI Eyes: No visual changes. ENT: No sore throat. Cardiovascular: Denies chest pain. Respiratory: Denies shortness of breath. Gastrointestinal:   No nausea, no vomiting.  No diarrhea.  Genitourinary: per HPI Musculoskeletal: PER HPI Skin: Negative for rash. Neurological: Negative for headaches, focal weakness or numbness. 10-point ROS otherwise negative.  ____________________________________________   PHYSICAL EXAM:  VITAL SIGNS: ED Triage Vitals  Enc Vitals Group     BP 12/24/15 1819 148/95 mmHg     Pulse Rate 12/24/15 1819 71     Resp 12/24/15 1819 20     Temp 12/24/15 1819 98.9 F (37.2 C)     Temp Source 12/24/15 1819 Oral     SpO2 12/24/15 1819 98 %     Weight 12/24/15 1819 200 lb (90.719 kg)     Height 12/24/15  1819 5\' 2"  (1.575 m)     Head Cir --      Peak Flow --      Pain Score 12/24/15 1818 10     Pain Loc --      Pain Edu? --      Excl. in [place]? --     Constitutional: Alert and oriented. Well appearing and in no acute distress. Eyes: Conjunctivae are normal. PERRL. EOMI. Ears:  Clear with normal landmarks. No erythema. Head: Atraumatic. Nose: No congestion/rhinnorhea. Mouth/Throat: Mucous membranes are moist.  Oropharynx non-erythematous. No lesions. Neck:  Supple.  No adenopathy.   Cardiovascular: Normal rate, regular rhythm. Grossly normal heart sounds.  Good peripheral circulation. Respiratory: Normal respiratory effort.  No retractions. Lungs CTAB. Gastrointestinal: Soft and nontender. No distention. No abdominal bruits. MILD CVA tenderness. Musculoskeletal: Nml ROM of upper and lower extremity joints. Neurologic:   Normal speech and language. No gross focal neurologic deficits are appreciated. No gait instability. Skin:  Skin is warm, dry and intact. No rash noted. Psychiatric: Mood and affect are normal. Speech and behavior are normal.  ____________________________________________   LABS (all labs ordered are listed, but only abnormal results are displayed)  Labs Reviewed  URINALYSIS COMPLETEWITH MICROSCOPIC ([place]) - Abnormal; Notable for the following:    Color, Urine YELLOW (*)    APPearance CLOUDY (*)    Hgb urine dipstick 2+ (*)    Protein, ur 30 (*)    Nitrite POSITIVE (*)    Leukocytes, UA 3+ (*)    Bacteria, UA MANY (*)    Squamous Epithelial / LPF 0-5 (*)    All other components within normal limits   ____________________________________________  EKG    ____________________________________________  RADIOLOGY    ____________________________________________   PROCEDURES  Procedure(s) performed: None  Critical Care performed: No  ____________________________________________   INITIAL IMPRESSION / ASSESSMENT AND PLAN / ED COURSE  Pertinent labs & imaging results that were available during my care of the patient were reviewed by me and considered in my medical decision making (see chart for details).  40 year old female with Escherichia coli UTI resistant to Bactrim. She is given Rocephin 1 g IM in the ER. She is started on Cipro as this was sensitive according to culture last week. I expect significant improvement over the next few days, but she will return to emergency room for any worsening symptoms or if not improving especially if she develops nausea and vomiting and is unable to take her antibiotics., ____________________________________________   FINAL CLINICAL IMPRESSION(S) / ED DIAGNOSES  Final diagnoses:  UTI (lower urinary tract infection)  Midline low back pain without sciatica      Mortimer Fries, PA-C 12/24/15 1907  Delman Kitten, MD 12/24/15  [id number]

## 2015-12-26 LAB — URINE CULTURE

## 2016-05-10 ENCOUNTER — Emergency Department
Admission: EM | Admit: 2016-05-10 | Discharge: 2016-05-10 | Disposition: A | Discharge status: Home / Self Care | Payer: BLUE CROSS/BLUE SHIELD | Attending: Emergency Medicine | Admitting: Emergency Medicine

## 2016-05-10 DIAGNOSIS — Y929 Unspecified place or not applicable: Secondary | ICD-10-CM | POA: Insufficient documentation

## 2016-05-10 DIAGNOSIS — S40861A Insect bite (nonvenomous) of right upper arm, initial encounter: Secondary | ICD-10-CM | POA: Diagnosis not present

## 2016-05-10 DIAGNOSIS — R21 Rash and other nonspecific skin eruption: Secondary | ICD-10-CM | POA: Diagnosis present

## 2016-05-10 DIAGNOSIS — W57XXXA Bitten or stung by nonvenomous insect and other nonvenomous arthropods, initial encounter: Secondary | ICD-10-CM | POA: Diagnosis not present

## 2016-05-10 DIAGNOSIS — L03818 Cellulitis of other sites: Secondary | ICD-10-CM | POA: Insufficient documentation

## 2016-05-10 DIAGNOSIS — Y999 Unspecified external cause status: Secondary | ICD-10-CM | POA: Diagnosis not present

## 2016-05-10 DIAGNOSIS — Y939 Activity, unspecified: Secondary | ICD-10-CM | POA: Diagnosis not present

## 2016-05-10 DIAGNOSIS — Z791 Long term (current) use of non-steroidal anti-inflammatories (NSAID): Secondary | ICD-10-CM | POA: Insufficient documentation

## 2016-05-10 MED ORDER — SULFAMETHOXAZOLE-TRIMETHOPRIM 800-160 MG PO TABS: 1.0000 | ORAL_TABLET | Freq: Two times a day (BID) | ORAL | Status: DC

## 2016-05-10 MED ORDER — PREDNISONE 10 MG PO TABS: 10.0000 mg | ORAL_TABLET | Freq: Every day | ORAL | Status: DC

## 2016-05-10 NOTE — ED Provider Notes (Signed)
CSN: [id number]     Arrival date & time 05/10/16  1623 History   First MD Initiated Contact with Patient 05/10/16 1713     Chief Complaint  Patient presents with  . Rash     (Consider location/radiation/quality/duration/timing/severity/associated sxs/prior Treatment) HPI  40 year old female presents to emergency department for evaluation of skin rash. Patient believes rash is from bug bites. She does not remember feeling her seeing a bug bite. She has had whelps on her right arm inner aspect of the bicep along as left forearm and a few bites on her chest and left breast. These areas have developed into erythematous macules that are very pruritic. She denies any fevers, joint pain, chest pain, shortness of breath, facial swelling, tick bites. She has not taken any medications for itching. Her right arm is most symptomatic and she feels as if the arm is warm and indurated. She denies any numbness or tingling throughout the right upper extremity.  History reviewed. No pertinent past medical history. History reviewed. No pertinent past surgical history. No family history on file. Social History  Substance Use Topics  . Smoking status: Never Smoker   . Smokeless tobacco: Never Used  . Alcohol Use: No   OB History    No data available     Review of Systems  Constitutional: Negative for fever, chills, activity change and fatigue.  HENT: Negative for congestion, sinus pressure and sore throat.   Eyes: Negative for visual disturbance.  Respiratory: Negative for cough, chest tightness and shortness of breath.   Cardiovascular: Negative for chest pain and leg swelling.  Gastrointestinal: Negative for nausea, vomiting, abdominal pain and diarrhea.  Genitourinary: Negative for dysuria.  Musculoskeletal: Negative for arthralgias and gait problem.  Skin: Positive for rash.  Neurological: Negative for weakness, numbness and headaches.  Hematological: Negative for adenopathy.   Psychiatric/Behavioral: Negative for behavioral problems, confusion and agitation.      Allergies  Review of patient's allergies indicates no known allergies.  Home Medications   Prior to Admission medications   Medication Sig Start Date End Date Taking? Authorizing Provider  cyclobenzaprine (FLEXERIL) 5 MG tablet Take 1 tablet (5 mg total) by mouth 3 (three) times daily as needed for muscle spasms. 12/19/15   Jenise V Bacon Menshew, PA-C  fluconazole (DIFLUCAN) 150 MG tablet Take 1 tablet (150 mg total) by mouth once. May repeat in 1 week. 12/19/15   Jenise V Bacon Menshew, PA-C  HYDROcodone-acetaminophen (NORCO) 5-325 MG tablet Take 1 tablet by mouth every 4 (four) hours as needed for moderate pain. 12/24/15   Mortimer Fries, PA-C  hydrocortisone cream 0.5 % Apply 1 application topically 2 (two) times daily. 08/22/15   Pierce Crane Beers, PA-C  ibuprofen (ADVIL,MOTRIN) 800 MG tablet Take 1 tablet (800 mg total) by mouth every 8 (eight) hours as needed. 08/18/15   Arlyss Repress, PA-C  naproxen (EC NAPROSYN) 500 MG EC tablet Take 1 tablet (500 mg total) by mouth 2 (two) times daily with a meal. 12/19/15   Jenise V Bacon Menshew, PA-C  predniSONE (DELTASONE) 10 MG tablet Take 1 tablet (10 mg total) by mouth daily. 6,5,4,3,2,1 six day taper 05/10/16   Duanne Guess, PA-C  sulfamethoxazole-trimethoprim (BACTRIM DS,SEPTRA DS) 800-160 MG tablet Take 1 tablet by mouth 2 (two) times daily. X 7 days 05/10/16   Duanne Guess, PA-C   BP 131/91 mmHg  Pulse 99  Temp(Src) 98.7 F (37.1 C) (Oral)  Resp 17  Ht 5\' 2"  (1.575 m)  Wt 99.791 kg  BMI 40.23 kg/m2  SpO2 96%  LMP 04/10/2016 (Exact Date) Physical Exam  Constitutional: She is oriented to person, place, and time. She appears well-developed and well-nourished. No distress.  HENT:  Head: Normocephalic and atraumatic.  Mouth/Throat: Oropharynx is clear and moist.  Eyes: EOM are normal. Pupils are equal, round, and reactive to light. Right eye  exhibits no discharge. Left eye exhibits no discharge.  Neck: Normal range of motion. Neck supple.  Cardiovascular: Normal rate, regular rhythm and intact distal pulses.   Pulmonary/Chest: Effort normal and breath sounds normal. No respiratory distress. She exhibits no tenderness.  Abdominal: Soft. She exhibits no distension. There is no tenderness.  Musculoskeletal: Normal range of motion. She exhibits no edema.  Neurological: She is alert and oriented to person, place, and time. She has normal reflexes.  Skin:  Examination of the right arm inner aspect of the biceps shows patient has a 4 cm area of induration with no fluctuance. There is a centralized area that appears to be a bug bite with no sign of abscess formation. There is no drainage. There is mild warmth and erythema. Patient has small areas of macular erythematous lesions along the left breast, left arm and chest. There is no warmth or erythema along the other lesions throughout the body.  Psychiatric: She has a normal mood and affect. Her behavior is normal. Thought content normal.    ED Course  Procedures (including critical care time) Labs Review Labs Reviewed - No data to display  Imaging Review No results found. I have personally reviewed and evaluated these images and lab results as part of my medical decision-making.   EKG Interpretation None      MDM   Final diagnoses:  Insect bite  Cellulitis of other specified site    40 year old female appears to have had insect bites. The right arm appears to have a mild cellulitis. She will start Bactrim DS 1 tab by mouth twice a day for 7 days. Benadryl for itching. She will keep her nails trimmed short. She will return to the ER for any worsening symptoms urgent changes in her health. Patient has thoroughly checked herself or tics and denies any tick exposure.  Duanne Guess, PA-C 05/10/16 1744  Hinda Kehr, MD 05/10/16 2230

## 2016-05-10 NOTE — ED Notes (Signed)
Red rash and welts to bilateral upper inner arms and to bilateral breast; itching. Pt alert and oriented X4, active, cooperative, pt in NAD. RR even and unlabored, color WNL.

## 2016-05-10 NOTE — Discharge Instructions (Signed)
Cellulitis Cellulitis is an infection of the skin and the tissue beneath it. The infected area is usually red and tender. Cellulitis occurs most often in the arms and lower legs.  CAUSES  Cellulitis is caused by bacteria that enter the skin through cracks or cuts in the skin. The most common types of bacteria that cause cellulitis are staphylococci and streptococci. SIGNS AND SYMPTOMS   Redness and warmth.  Swelling.  Tenderness or pain.  Fever. DIAGNOSIS  Your health care provider can usually determine what is wrong based on a physical exam. Blood tests may also be done. TREATMENT  Treatment usually involves taking an antibiotic medicine. HOME CARE INSTRUCTIONS   Take your antibiotic medicine as directed by your health care provider. Finish the antibiotic even if you start to feel better.  Keep the infected arm or leg elevated to reduce swelling.  Apply a warm cloth to the affected area up to 4 times per day to relieve pain.  Take medicines only as directed by your health care provider.  Keep all follow-up visits as directed by your health care provider. SEEK MEDICAL CARE IF:   You notice red streaks coming from the infected area.  Your red area gets larger or turns dark in color.  Your bone or joint underneath the infected area becomes painful after the skin has healed.  Your infection returns in the same area or another area.  You notice a swollen bump in the infected area.  You develop new symptoms.  You have a fever. SEEK IMMEDIATE MEDICAL CARE IF:   You feel very sleepy.  You develop vomiting or diarrhea.  You have a general ill feeling (malaise) with muscle aches and pains.   This information is not intended to replace advice given to you by your health care provider. Make sure you discuss any questions you have with your health care provider.   Document Released: 08/15/2005 Document Revised: 07/27/2015 Document Reviewed: 01/21/2012 Elsevier Interactive  Patient Education 2016 [employer].   Please return to the emergency department for any fevers, increased pain swelling or redness. Return to ER for any worsening symptoms urgent changes in her health.

## 2016-05-29 DIAGNOSIS — L509 Urticaria, unspecified: Secondary | ICD-10-CM | POA: Insufficient documentation

## 2016-05-29 DIAGNOSIS — T7840XA Allergy, unspecified, initial encounter: Secondary | ICD-10-CM | POA: Diagnosis present

## 2016-05-29 DIAGNOSIS — Z79899 Other long term (current) drug therapy: Secondary | ICD-10-CM | POA: Diagnosis not present

## 2016-05-29 DIAGNOSIS — Z792 Long term (current) use of antibiotics: Secondary | ICD-10-CM | POA: Diagnosis not present

## 2016-05-29 DIAGNOSIS — Z791 Long term (current) use of non-steroidal anti-inflammatories (NSAID): Secondary | ICD-10-CM | POA: Insufficient documentation

## 2016-05-29 DIAGNOSIS — Z7952 Long term (current) use of systemic steroids: Secondary | ICD-10-CM | POA: Insufficient documentation

## 2016-05-29 NOTE — ED Notes (Signed)
Pt with rash to arms bilat x 2 weeks was seen here for the same and treated with antibiotics for possible insect bite. States worsened yesterday co itching.

## 2016-05-30 ENCOUNTER — Emergency Department
Admission: EM | Admit: 2016-05-30 | Discharge: 2016-05-30 | Disposition: A | Discharge status: Home / Self Care | Payer: BLUE CROSS/BLUE SHIELD | Attending: Emergency Medicine | Admitting: Emergency Medicine

## 2016-05-30 DIAGNOSIS — T7840XA Allergy, unspecified, initial encounter: Secondary | ICD-10-CM

## 2016-05-30 DIAGNOSIS — L509 Urticaria, unspecified: Secondary | ICD-10-CM

## 2016-05-30 MED ORDER — PREDNISONE 20 MG PO TABS: 60.0000 mg | ORAL_TABLET | Freq: Every day | ORAL | Status: DC

## 2016-05-30 MED ORDER — CEPHALEXIN 500 MG PO CAPS: 500.0000 mg | ORAL_CAPSULE | Freq: Two times a day (BID) | ORAL | Status: AC

## 2016-05-30 MED ORDER — CEPHALEXIN 500 MG PO CAPS
500.0000 mg | ORAL_CAPSULE | Freq: Once | ORAL | Status: AC
Start: 2016-05-30 — End: 2016-05-30
  Administered 2016-05-30: 500 mg via ORAL
  Filled 2016-05-30: qty 1

## 2016-05-30 MED ORDER — CETIRIZINE HCL 5 MG/5ML PO SYRP
5.0000 mg | ORAL_SOLUTION | Freq: Once | ORAL | Status: AC
  Administered 2016-05-30: 5 mg via ORAL
  Filled 2016-05-30 (×2): qty 5

## 2016-05-30 MED ORDER — PREDNISONE 20 MG PO TABS
60.0000 mg | ORAL_TABLET | Freq: Once | ORAL | Status: AC
  Administered 2016-05-30: 60 mg via ORAL
  Filled 2016-05-30: qty 3

## 2016-05-30 NOTE — ED Provider Notes (Signed)
[facility] Emergency Department Provider Note  ____________________________________________  Time seen: 4:30 AM  I have reviewed the triage vital signs and the nursing notes.   HISTORY  Chief Complaint Rash     HPI Nicole Mcgee is a 40 y.o. female presents with bilateral upper extremity rash. Patient was seen in the emergency department for the same on 05/10/2016. Patient denies any bug bites no fevers no nausea or vomiting. Patient states she had one episode of this last year.  Past medical history None There are no active problems to display for this patient.   Past surgical history None  Current Outpatient Rx  Name  Route  Sig  Dispense  Refill  . cyclobenzaprine (FLEXERIL) 5 MG tablet   Oral   Take 1 tablet (5 mg total) by mouth 3 (three) times daily as needed for muscle spasms.   15 tablet   0   . fluconazole (DIFLUCAN) 150 MG tablet   Oral   Take 1 tablet (150 mg total) by mouth once. May repeat in 1 week.   2 tablet   0   . HYDROcodone-acetaminophen (NORCO) 5-325 MG tablet   Oral   Take 1 tablet by mouth every 4 (four) hours as needed for moderate pain.   6 tablet   0   . hydrocortisone cream 0.5 %   Topical   Apply 1 application topically 2 (two) times daily.   30 g   0   . ibuprofen (ADVIL,MOTRIN) 800 MG tablet   Oral   Take 1 tablet (800 mg total) by mouth every 8 (eight) hours as needed.   30 tablet   0   . naproxen (EC NAPROSYN) 500 MG EC tablet   Oral   Take 1 tablet (500 mg total) by mouth 2 (two) times daily with a meal.   30 tablet   0   . predniSONE (DELTASONE) 10 MG tablet   Oral   Take 1 tablet (10 mg total) by mouth daily. 6,5,4,3,2,1 six day taper   21 tablet   0   . sulfamethoxazole-trimethoprim (BACTRIM DS,SEPTRA DS) 800-160 MG tablet   Oral   Take 1 tablet by mouth 2 (two) times daily. X 7 days   14 tablet   0     Allergies No known drug allergies No family history on file.  Social  History Social History  Substance Use Topics  . Smoking status: Never Smoker   . Smokeless tobacco: Never Used  . Alcohol Use: No    Review of Systems  Constitutional: Negative for fever. Eyes: Negative for visual changes. ENT: Negative for sore throat. Cardiovascular: Negative for chest pain. Respiratory: Negative for shortness of breath. Gastrointestinal: Negative for abdominal pain, vomiting and diarrhea. Genitourinary: Negative for dysuria. Musculoskeletal: Negative for back pain. Skin: Negative for rash. Neurological: Negative for headaches, focal weakness or numbness.   10-point ROS otherwise negative.  ____________________________________________   PHYSICAL EXAM:  VITAL SIGNS: ED Triage Vitals  Enc Vitals Group     BP 05/29/16 2326 134/78 mmHg     Pulse Rate 05/29/16 2326 88     Resp 05/29/16 2326 18     Temp 05/29/16 2326 98.4 F (36.9 C)     Temp Source 05/29/16 2326 Oral     SpO2 05/29/16 2326 100 %     Weight 05/29/16 2326 220 lb (99.791 kg)     Height 05/29/16 2326 5\' 2"  (1.575 m)     Head Cir --  Peak Flow --      Pain Score --      Pain Loc --      Pain Edu? --      Excl. in [place]? --     Constitutional: Alert and oriented. Well appearing and in no distress. Eyes: Conjunctivae are normal. PERRL. Normal extraocular movements. ENT   Head: Normocephalic and atraumatic.   Nose: No congestion/rhinnorhea.   Mouth/Throat: Mucous membranes are moist.   Neck: No stridor. Hematological/Lymphatic/Immunilogical: No cervical lymphadenopathy. Cardiovascular: Normal rate, regular rhythm. Normal and symmetric distal pulses are present in all extremities. No murmurs, rubs, or gallops. Respiratory: Normal respiratory effort without tachypnea nor retractions. Breath sounds are clear and equal bilaterally. No wheezes/rales/rhonchi. Gastrointestinal: Soft and nontender. No distention. There is no CVA tenderness. Genitourinary:  deferred Musculoskeletal: Nontender with normal range of motion in all extremities. No joint effusions.  No lower extremity tenderness nor edema. Neurologic:  Normal speech and language. No gross focal neurologic deficits are appreciated. Speech is normal.  Skin: Bilateral extremity hives noted Psychiatric: Mood and affect are normal. Speech and behavior are normal. Patient exhibits appropriate insight and judgment.     Procedures     INITIAL IMPRESSION / ASSESSMENT AND PLAN / ED COURSE  Pertinent labs & imaging results that were available during my care of the patient were reviewed by me and considered in my medical decision making (see chart for details).  Patient received prednisone cetirizine and  Keflex will be prescribed same for home. She and referred to Dr. Nehemiah Massed dermatologist  ____________________________________________   FINAL CLINICAL IMPRESSION(S) / ED DIAGNOSES  Final diagnoses:  Allergic reaction, initial encounter  Hives      Gregor Hams, MD 06/03/16 [phone]

## 2016-05-30 NOTE — ED Notes (Signed)
Called pharmacy to get Zrytex syrup

## 2016-05-30 NOTE — ED Notes (Signed)
Pt presents to ED for rash that is on both wrists. Swelling noted to L wrist. Rash looks like small clear pustules/blisters. Pt states already being on antibiotic regimen.

## 2016-05-30 NOTE — Discharge Instructions (Signed)
Hives Hives are itchy, red, swollen areas of the skin. They can vary in size and location on your body. Hives can come and go for hours or several days (acute hives) or for several weeks (chronic hives). Hives do not spread from person to person (noncontagious). They may get worse with scratching, exercise, and emotional stress. CAUSES   Allergic reaction to food, additives, or drugs.  Infections, including the common cold.  Illness, such as vasculitis, lupus, or thyroid disease.  Exposure to sunlight, heat, or cold.  Exercise.  Stress.  Contact with chemicals. SYMPTOMS   Red or white swollen patches on the skin. The patches may change size, shape, and location quickly and repeatedly.  Itching.  Swelling of the hands, feet, and face. This may occur if hives develop deeper in the skin. DIAGNOSIS  Your caregiver can usually tell what is wrong by performing a physical exam. Skin or blood tests may also be done to determine the cause of your hives. In some cases, the cause cannot be determined. TREATMENT  Mild cases usually get better with medicines such as antihistamines. Severe cases may require an emergency epinephrine injection. If the cause of your hives is known, treatment includes avoiding that trigger.  HOME CARE INSTRUCTIONS   Avoid causes that trigger your hives.  Take antihistamines as directed by your caregiver to reduce the severity of your hives. Non-sedating or low-sedating antihistamines are usually recommended. Do not drive while taking an antihistamine.  Take any other medicines prescribed for itching as directed by your caregiver.  Wear loose-fitting clothing.  Keep all follow-up appointments as directed by your caregiver. SEEK MEDICAL CARE IF:   You have persistent or severe itching that is not relieved with medicine.  You have painful or swollen joints. SEEK IMMEDIATE MEDICAL CARE IF:   You have a fever.  Your tongue or lips are swollen.  You have  trouble breathing or swallowing.  You feel tightness in the throat or chest.  You have abdominal pain. These problems may be the first sign of a life-threatening allergic reaction. Call your local emergency services (911 in [place] MAKE SURE YOU:   Understand these instructions.  Will watch your condition.  Will get help right away if you are not doing well or get worse.   This information is not intended to replace advice given to you by your health care provider. Make sure you discuss any questions you have with your health care provider.   Document Released: 11/05/2005 Document Revised: 11/10/2013 Document Reviewed: 01/29/2012 Elsevier Interactive Patient Education 2016 Elsevier Inc.  

## 2017-01-25 IMAGING — CR DG CHEST 2V
1 series · 2 of 2 positions shown · non-contrast
Comparison: 09/24/2014

CLINICAL DATA: Cough and cold symptoms for over 2 weeks.

EXAM:
CHEST  2 VIEW

[Series 1: dg chest 2 view · 0.14mm/px · 2 of 2 slices shown]
[im 1/2]
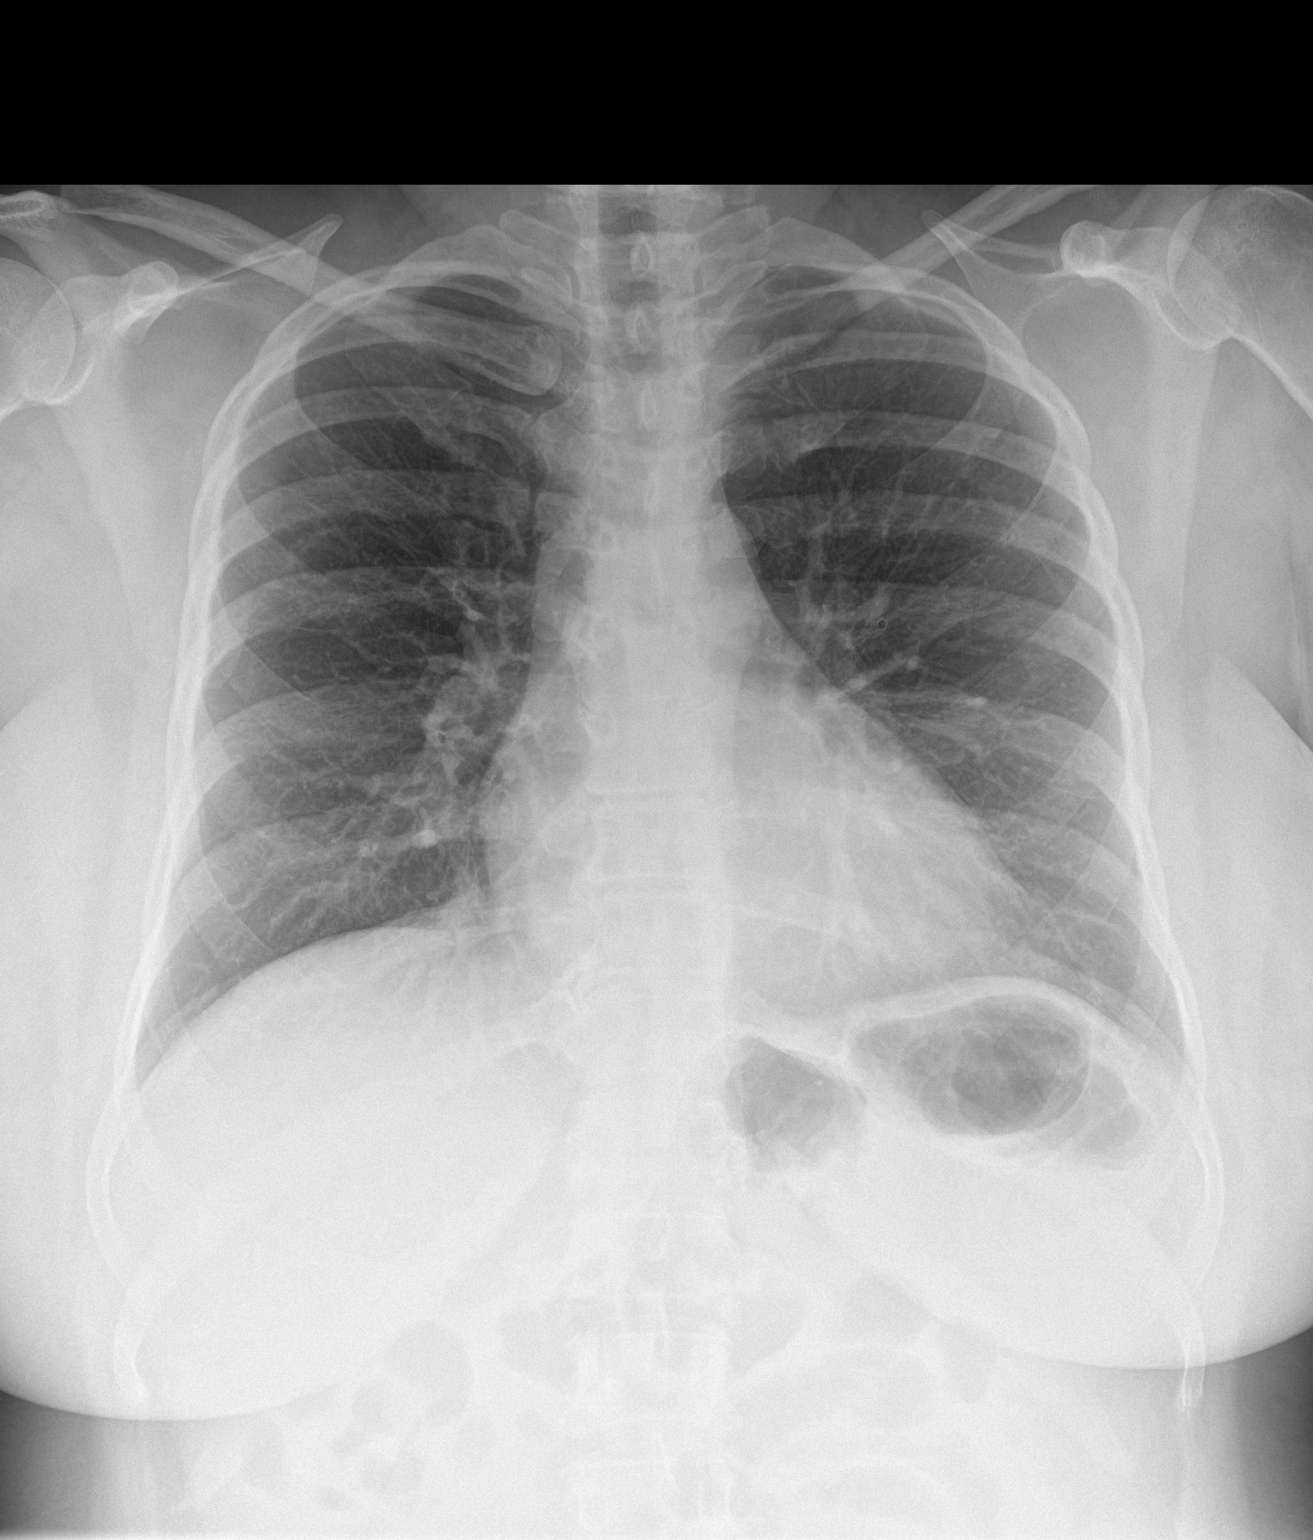
[im 2/2]
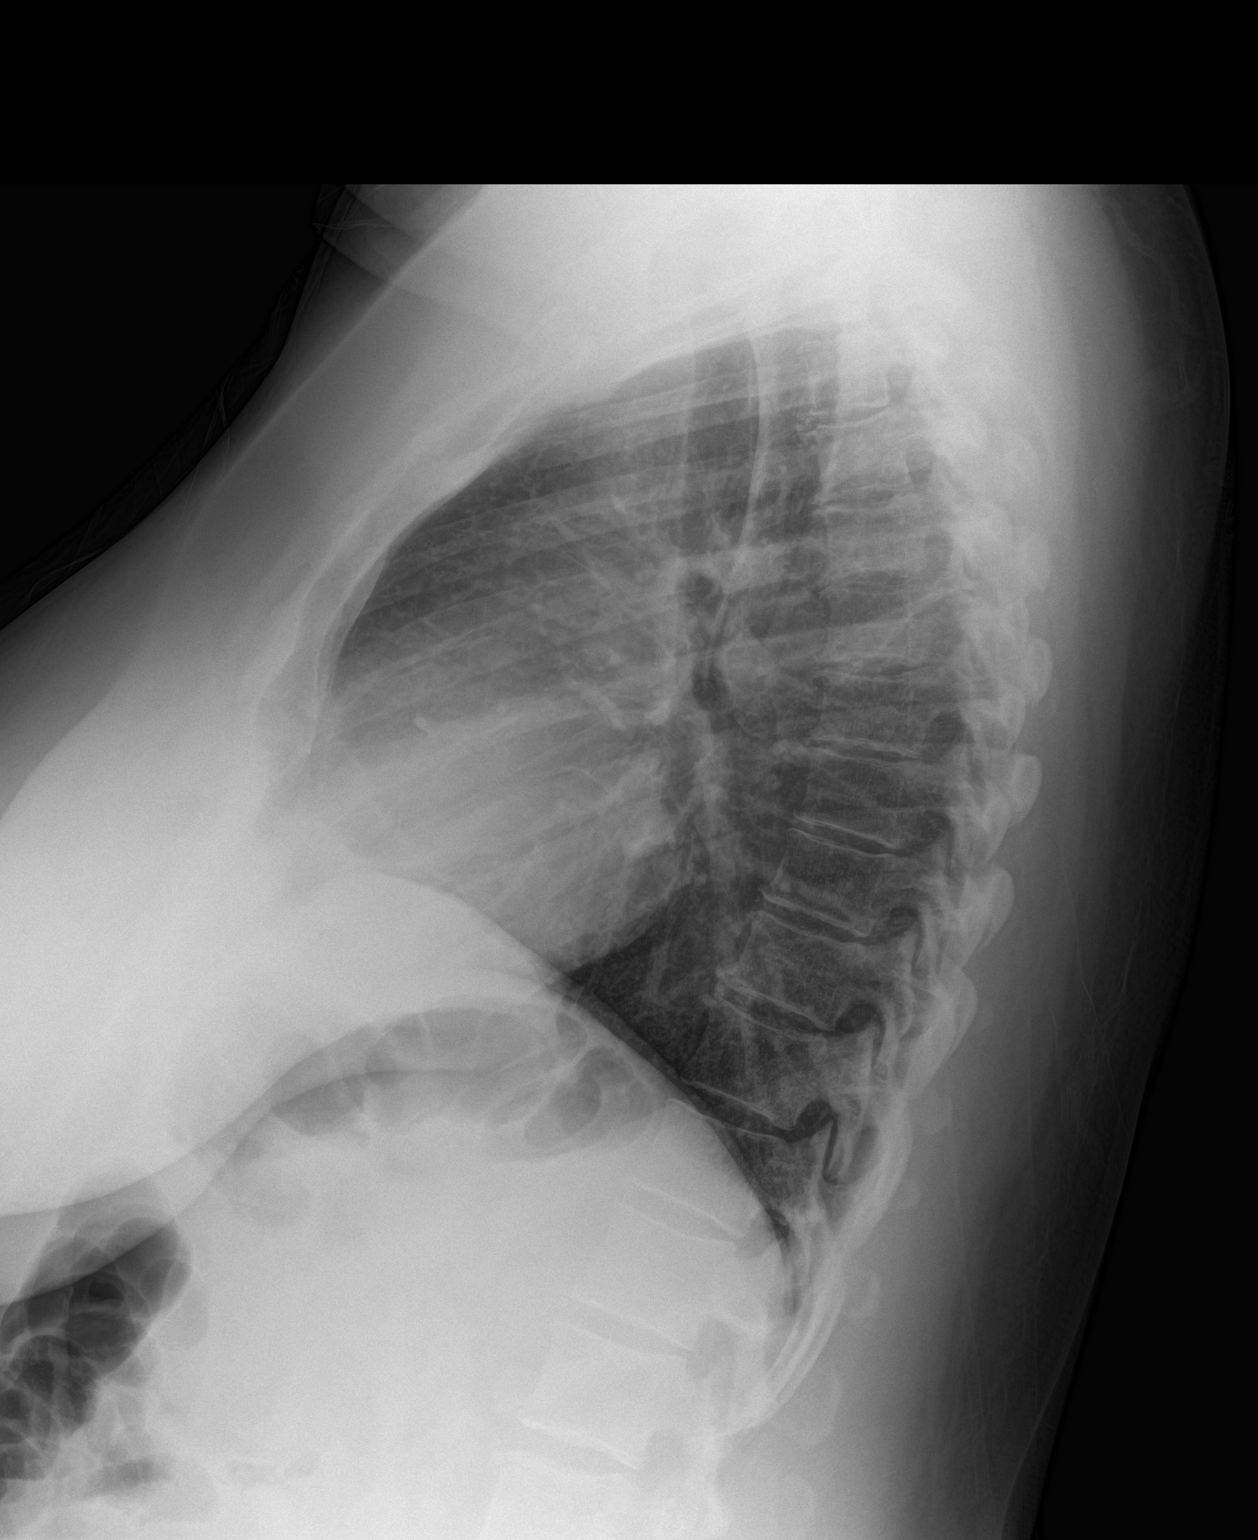

[2 of 2 positions shown; findings below may reference images not displayed]

FINDINGS: The heart size and mediastinal contours are within normal limits.
Both lungs are clear. No pleural effusion or pneumothorax. Bony
thorax is unremarkable.
IMPRESSION: No active cardiopulmonary disease.

## 2017-03-28 ENCOUNTER — Emergency Department
Admission: EM | Admit: 2017-03-28 | Discharge: 2017-03-28 | Disposition: A | Discharge status: Home / Self Care | Payer: BLUE CROSS/BLUE SHIELD | Attending: Emergency Medicine | Admitting: Emergency Medicine

## 2017-03-28 DIAGNOSIS — N3000 Acute cystitis without hematuria: Secondary | ICD-10-CM | POA: Diagnosis not present

## 2017-03-28 DIAGNOSIS — R300 Dysuria: Secondary | ICD-10-CM | POA: Diagnosis present

## 2017-03-28 LAB — URINALYSIS, ROUTINE W REFLEX MICROSCOPIC
Bilirubin Urine: NEGATIVE
GLUCOSE, UA: NEGATIVE mg/dL
KETONES UR: NEGATIVE mg/dL
NITRITE: NEGATIVE
PROTEIN: NEGATIVE mg/dL
SPECIFIC GRAVITY, URINE: 1.015 (ref 1.005–1.030)
pH: 5 (ref 5.0–8.0)

## 2017-03-28 LAB — POCT PREGNANCY, URINE: Preg Test, Ur: NEGATIVE

## 2017-03-28 MED ORDER — SULFAMETHOXAZOLE-TRIMETHOPRIM 800-160 MG PO TABS: 1.0000 | ORAL_TABLET | Freq: Two times a day (BID) | ORAL | 0 refills | Status: DC

## 2017-03-28 MED ORDER — PHENAZOPYRIDINE HCL 200 MG PO TABS: 200.0000 mg | ORAL_TABLET | Freq: Three times a day (TID) | ORAL | 0 refills | Status: DC | PRN

## 2017-03-28 MED ORDER — PHENAZOPYRIDINE HCL 200 MG PO TABS
200.0000 mg | ORAL_TABLET | Freq: Once | ORAL | Status: AC
  Administered 2017-03-28: 200 mg via ORAL
  Filled 2017-03-28: qty 1

## 2017-03-28 MED ORDER — FLUCONAZOLE 150 MG PO TABS: 150.0000 mg | ORAL_TABLET | Freq: Every day | ORAL | 0 refills | Status: DC

## 2017-03-28 MED ORDER — SULFAMETHOXAZOLE-TRIMETHOPRIM 800-160 MG PO TABS
1.0000 | ORAL_TABLET | Freq: Once | ORAL | Status: AC
  Administered 2017-03-28: 1 via ORAL
  Filled 2017-03-28: qty 1

## 2017-03-28 NOTE — ED Triage Notes (Signed)
Pt arrives to ER via POV c/o vaginal burning and irritation X 2 days. Burning pain with urination. Pt alert and oriented X4, active, cooperative, pt in NAD. RR even and unlabored, color WNL.

## 2017-03-28 NOTE — ED Notes (Signed)
See triage note  States she developed some urinary freq and dysuria couple of days ago afebrile at present

## 2017-03-28 NOTE — ED Provider Notes (Signed)
[facility] Emergency Department Provider Note   ____________________________________________   First MD Initiated Contact with Patient 03/28/17 1112     (approximate)  I have reviewed the triage vital signs and the nursing notes.   HISTORY  Chief Complaint Dysuria    HPI Nicole Mcgee is a 41 y.o. female patient complain a urinary frequency, urgency and dysuria for 2 days. Patient denies vaginal discharge, flank pain, or fever. No palliative measures for these complaints. Patient rates the pain as a 2/10. Patient described a pain as "burning".   History reviewed. No pertinent past medical history.  There are no active problems to display for this patient.   History reviewed. No pertinent surgical history.  Prior to Admission medications   Medication Sig Start Date End Date Taking? Authorizing Provider  cyclobenzaprine (FLEXERIL) 5 MG tablet Take 1 tablet (5 mg total) by mouth 3 (three) times daily as needed for muscle spasms. 12/19/15   Menshew, Dannielle Karvonen, PA-C  fluconazole (DIFLUCAN) 150 MG tablet Take 1 tablet (150 mg total) by mouth once. May repeat in 1 week. 12/19/15   Menshew, Dannielle Karvonen, PA-C  fluconazole (DIFLUCAN) 150 MG tablet Take 1 tablet (150 mg total) by mouth daily. 03/28/17   Sable Feil, PA-C  HYDROcodone-acetaminophen (NORCO) 5-325 MG tablet Take 1 tablet by mouth every 4 (four) hours as needed for moderate pain. 12/24/15   Mortimer Fries, PA-C  hydrocortisone cream 0.5 % Apply 1 application topically 2 (two) times daily. 08/22/15   Beers, Pierce Crane, PA-C  ibuprofen (ADVIL,MOTRIN) 800 MG tablet Take 1 tablet (800 mg total) by mouth every 8 (eight) hours as needed. 08/18/15   Beers, Pierce Crane, PA-C  naproxen (EC NAPROSYN) 500 MG EC tablet Take 1 tablet (500 mg total) by mouth 2 (two) times daily with a meal. 12/19/15   Menshew, Dannielle Karvonen, PA-C  phenazopyridine (PYRIDIUM) 200 MG tablet Take 1 tablet (200 mg total) by mouth  3 (three) times daily as needed for pain. 03/28/17   Sable Feil, PA-C  predniSONE (DELTASONE) 10 MG tablet Take 1 tablet (10 mg total) by mouth daily. 6,5,4,3,2,1 six day taper 05/10/16   Duanne Guess, PA-C  predniSONE (DELTASONE) 20 MG tablet Take 3 tablets (60 mg total) by mouth daily with breakfast. 05/30/16   Gregor Hams, MD  sulfamethoxazole-trimethoprim (BACTRIM DS,SEPTRA DS) 800-160 MG tablet Take 1 tablet by mouth 2 (two) times daily. X 7 days 05/10/16   Duanne Guess, PA-C  sulfamethoxazole-trimethoprim (BACTRIM DS,SEPTRA DS) 800-160 MG tablet Take 1 tablet by mouth 2 (two) times daily. 03/28/17   Sable Feil, PA-C    Allergies Patient has no known allergies.  No family history on file.  Social History Social History  Substance Use Topics  . Smoking status: Never Smoker  . Smokeless tobacco: Never Used  . Alcohol use No    Review of Systems  Constitutional: No fever/chills Eyes: No visual changes. ENT: No sore throat. Cardiovascular: Denies chest pain. Respiratory: Denies shortness of breath. Gastrointestinal: No abdominal pain.  No nausea, no vomiting.  No diarrhea.  No constipation. Genitourinary: Negative for dysuria. Musculoskeletal: Negative for back pain. Skin: Negative for rash. Neurological: Negative for headaches, focal weakness or numbness.   ____________________________________________   PHYSICAL EXAM:  VITAL SIGNS: ED Triage Vitals  Enc Vitals Group     BP 03/28/17 1030 122/88     Pulse Rate 03/28/17 1030 85     Resp 03/28/17  1030 16     Temp 03/28/17 1030 98.5 F (36.9 C)     Temp Source 03/28/17 1030 Oral     SpO2 03/28/17 1030 98 %     Weight 03/28/17 1030 200 lb (90.7 kg)     Height 03/28/17 1030 5\' 2"  (1.575 m)     Head Circumference --      Peak Flow --      Pain Score 03/28/17 1028 2     Pain Loc --      Pain Edu? --      Excl. in [place]? --     Constitutional: Alert and oriented. Well appearing and in no acute  distress. Eyes: Conjunctivae are normal. PERRL. EOMI. Head: Atraumatic. Nose: No congestion/rhinnorhea. Mouth/Throat: Mucous membranes are moist.  Oropharynx non-erythematous. Neck: No stridor.  No cervical spine tenderness to palpation. Hematological/Lymphatic/Immunilogical: No cervical lymphadenopathy. Cardiovascular: Normal rate, regular rhythm. Grossly normal heart sounds.  Good peripheral circulation. Respiratory: Normal respiratory effort.  No retractions. Lungs CTAB. Gastrointestinal: Soft and nontender. No distention. No abdominal bruits. No CVA tenderness. Musculoskeletal: No lower extremity tenderness nor edema.  No joint effusions. Neurologic:  Normal speech and language. No gross focal neurologic deficits are appreciated. No gait instability. Skin:  Skin is warm, dry and intact. No rash noted. Psychiatric: Mood and affect are normal. Speech and behavior are normal.  ____________________________________________   LABS (all labs ordered are listed, but only abnormal results are displayed)  Labs Reviewed  URINALYSIS, ROUTINE W REFLEX MICROSCOPIC - Abnormal; Notable for the following:       Result Value   Color, Urine YELLOW (*)    APPearance CLOUDY (*)    Hgb urine dipstick MODERATE (*)    Leukocytes, UA LARGE (*)    Bacteria, UA RARE (*)    Squamous Epithelial / LPF 6-30 (*)    All other components within normal limits  POC URINE PREG, ED  POCT PREGNANCY, URINE   ____________________________________________  EKG   ____________________________________________  RADIOLOGY   ____________________________________________   PROCEDURES  Procedure(s) performed: None  Procedures  Critical Care performed: No  ____________________________________________   INITIAL IMPRESSION / ASSESSMENT AND PLAN / ED COURSE  Pertinent labs & imaging results that were available during my care of the patient were reviewed by me and considered in my medical decision making (see  chart for details).  Urinary tract infection. Discussed the results with patient. Patient given discharge care instructions. Patient advised to follow family clinic to have urine retested in 10 days.      ____________________________________________   FINAL CLINICAL IMPRESSION(S) / ED DIAGNOSES  Final diagnoses:  Acute cystitis without hematuria      NEW MEDICATIONS STARTED DURING THIS VISIT:  New Prescriptions   FLUCONAZOLE (DIFLUCAN) 150 MG TABLET    Take 1 tablet (150 mg total) by mouth daily.   PHENAZOPYRIDINE (PYRIDIUM) 200 MG TABLET    Take 1 tablet (200 mg total) by mouth 3 (three) times daily as needed for pain.   SULFAMETHOXAZOLE-TRIMETHOPRIM (BACTRIM DS,SEPTRA DS) 800-160 MG TABLET    Take 1 tablet by mouth 2 (two) times daily.     Note:  This document was prepared using Dragon voice recognition software and may include unintentional dictation errors.    Sable Feil, PA-C 03/28/17 1121    Delman Kitten, MD 03/28/17 [phone]

## 2017-04-12 DIAGNOSIS — R300 Dysuria: Secondary | ICD-10-CM | POA: Diagnosis not present

## 2017-04-12 DIAGNOSIS — N3091 Cystitis, unspecified with hematuria: Secondary | ICD-10-CM | POA: Insufficient documentation

## 2017-04-12 DIAGNOSIS — A5901 Trichomonal vulvovaginitis: Secondary | ICD-10-CM | POA: Diagnosis not present

## 2017-04-12 LAB — URINALYSIS, COMPLETE (UACMP) WITH MICROSCOPIC
Bilirubin Urine: NEGATIVE
GLUCOSE, UA: NEGATIVE mg/dL
Ketones, ur: NEGATIVE mg/dL
NITRITE: NEGATIVE
PH: 5 (ref 5.0–8.0)
Protein, ur: NEGATIVE mg/dL
Specific Gravity, Urine: 1.018 (ref 1.005–1.030)

## 2017-04-12 NOTE — ED Triage Notes (Signed)
Patient states that she was seen here about 10 days ago and diagnosed with UTI. Patient states that she completed the antibiotic but does not feel better. Patient states that she is having difficulty urinating now.

## 2017-04-13 ENCOUNTER — Emergency Department
Admission: EM | Admit: 2017-04-13 | Discharge: 2017-04-13 | Disposition: A | Discharge status: Home / Self Care | Payer: BLUE CROSS/BLUE SHIELD | Attending: Emergency Medicine | Admitting: Emergency Medicine

## 2017-04-13 ENCOUNTER — Emergency Department: Payer: BLUE CROSS/BLUE SHIELD

## 2017-04-13 DIAGNOSIS — R300 Dysuria: Secondary | ICD-10-CM

## 2017-04-13 DIAGNOSIS — N3090 Cystitis, unspecified without hematuria: Secondary | ICD-10-CM

## 2017-04-13 DIAGNOSIS — A5901 Trichomonal vulvovaginitis: Secondary | ICD-10-CM

## 2017-04-13 LAB — WET PREP, GENITAL
Clue Cells Wet Prep HPF POC: NONE SEEN
Sperm: NONE SEEN
YEAST WET PREP: NONE SEEN

## 2017-04-13 LAB — PREGNANCY, URINE: PREG TEST UR: NEGATIVE

## 2017-04-13 LAB — CHLAMYDIA/NGC RT PCR (ARMC ONLY)
Chlamydia Tr: NOT DETECTED
N GONORRHOEAE: NOT DETECTED

## 2017-04-13 MED ORDER — LIDOCAINE HCL (PF) 1 % IJ SOLN
INTRAMUSCULAR | Status: AC
  Administered 2017-04-13: 2.1 mL via INTRADERMAL
  Filled 2017-04-13: qty 5

## 2017-04-13 MED ORDER — METRONIDAZOLE 500 MG PO TABS: 500.0000 mg | ORAL_TABLET | Freq: Two times a day (BID) | ORAL | 0 refills | Status: AC

## 2017-04-13 MED ORDER — LIDOCAINE HCL (PF) 1 % IJ SOLN
5.0000 mL | Freq: Once | INTRAMUSCULAR | Status: AC
  Administered 2017-04-13: 2.1 mL via INTRADERMAL

## 2017-04-13 MED ORDER — METRONIDAZOLE 500 MG PO TABS
500.0000 mg | ORAL_TABLET | Freq: Once | ORAL | Status: AC
  Administered 2017-04-13: 500 mg via ORAL
  Filled 2017-04-13: qty 1

## 2017-04-13 MED ORDER — CEFTRIAXONE SODIUM 1 G IJ SOLR
1.0000 g | Freq: Once | INTRAMUSCULAR | Status: AC
  Administered 2017-04-13: 1 g via INTRAMUSCULAR
  Filled 2017-04-13: qty 10

## 2017-04-13 MED ORDER — CEFDINIR 300 MG PO CAPS: 300.0000 mg | ORAL_CAPSULE | Freq: Two times a day (BID) | ORAL | 0 refills | Status: DC

## 2017-04-13 NOTE — ED Notes (Signed)
Pt. Going home by self. 

## 2017-04-13 NOTE — ED Notes (Addendum)
Pt states that  She was treated for a UTI abt a week and a half ago. States that she did finish the antibiotic (Bactrium and diflucan), but symptoms still persist. Symptoms include irritation in vagina, burning, and white discharge. PT says discharge is also pink in color as if theres blood in it. Pain in right kidney area.

## 2017-04-13 NOTE — Discharge Instructions (Signed)
Please follow up with your primary care physician and your OB/GYN

## 2017-04-13 NOTE — ED Provider Notes (Signed)
[facility] Emergency Department Provider Note   ____________________________________________   First MD Initiated Contact with Patient 04/13/17 0120     (approximate)  I have reviewed the triage vital signs and the nursing notes.   HISTORY  Chief Complaint Urinary Tract Infection    HPI Nicole Mcgee is a 41 y.o. female who comes into the hospital today with a concern for urinary tract infection. The patient was here approximately week and a half ago and was treated for UTI. She was treated with Bactrim and reports that she took all of her antibiotics as well as Diflucan approximate 3 days after completing her antibiotics. The patient reports though that she has some vaginal irritation and some copious white discharge. The patient also reports she still has some right-sided kidney pain and burning with urination. The patient has not had any fevers. The patient reports that she has a history of frequent UTIs and this feels like her UTIs which she has had in the past. She reports she typically receives a dose of Rocephin but she did not this time. The patient rates her pain a 5 out of 10 in intensity. She is here today for evaluation.   History reviewed. No pertinent past medical history.  There are no active problems to display for this patient.   History reviewed. No pertinent surgical history.  Prior to Admission medications   Medication Sig Start Date End Date Taking? Authorizing Provider  cefdinir (OMNICEF) 300 MG capsule Take 1 capsule (300 mg total) by mouth 2 (two) times daily. 04/13/17   Loney Hering, MD  cyclobenzaprine (FLEXERIL) 5 MG tablet Take 1 tablet (5 mg total) by mouth 3 (three) times daily as needed for muscle spasms. 12/19/15   Menshew, Dannielle Karvonen, PA-C  fluconazole (DIFLUCAN) 150 MG tablet Take 1 tablet (150 mg total) by mouth once. May repeat in 1 week. 12/19/15   Menshew, Dannielle Karvonen, PA-C  fluconazole (DIFLUCAN) 150 MG  tablet Take 1 tablet (150 mg total) by mouth daily. 03/28/17   Sable Feil, PA-C  HYDROcodone-acetaminophen (NORCO) 5-325 MG tablet Take 1 tablet by mouth every 4 (four) hours as needed for moderate pain. 12/24/15   Mortimer Fries, PA-C  hydrocortisone cream 0.5 % Apply 1 application topically 2 (two) times daily. 08/22/15   Beers, Pierce Crane, PA-C  ibuprofen (ADVIL,MOTRIN) 800 MG tablet Take 1 tablet (800 mg total) by mouth every 8 (eight) hours as needed. 08/18/15   Beers, Pierce Crane, PA-C  metroNIDAZOLE (FLAGYL) 500 MG tablet Take 1 tablet (500 mg total) by mouth 2 (two) times daily. 04/13/17 04/20/17  Loney Hering, MD  naproxen (EC NAPROSYN) 500 MG EC tablet Take 1 tablet (500 mg total) by mouth 2 (two) times daily with a meal. 12/19/15   Menshew, Dannielle Karvonen, PA-C  phenazopyridine (PYRIDIUM) 200 MG tablet Take 1 tablet (200 mg total) by mouth 3 (three) times daily as needed for pain. 03/28/17   Sable Feil, PA-C  predniSONE (DELTASONE) 10 MG tablet Take 1 tablet (10 mg total) by mouth daily. 6,5,4,3,2,1 six day taper 05/10/16   Duanne Guess, PA-C  predniSONE (DELTASONE) 20 MG tablet Take 3 tablets (60 mg total) by mouth daily with breakfast. 05/30/16   Gregor Hams, MD  sulfamethoxazole-trimethoprim (BACTRIM DS,SEPTRA DS) 800-160 MG tablet Take 1 tablet by mouth 2 (two) times daily. X 7 days 05/10/16   Duanne Guess, PA-C  sulfamethoxazole-trimethoprim (BACTRIM DS,SEPTRA DS) 800-160 MG  tablet Take 1 tablet by mouth 2 (two) times daily. 03/28/17   Sable Feil, PA-C    Allergies Patient has no known allergies.  No family history on file.  Social History Social History  Substance Use Topics  . Smoking status: Never Smoker  . Smokeless tobacco: Never Used  . Alcohol use No    Review of Systems  Constitutional: No fever/chills Eyes: No visual changes. ENT: No sore throat. Cardiovascular: Denies chest pain. Respiratory: Denies shortness of breath. Gastrointestinal:   abdominal pain.  No nausea, no vomiting.  No diarrhea.  No constipation. Genitourinary:  Dysuria, vaginal discharge Musculoskeletal: back pain. Skin: Negative for rash. Neurological: Negative for headaches, focal weakness or numbness.   ____________________________________________   PHYSICAL EXAM:  VITAL SIGNS: ED Triage Vitals  Enc Vitals Group     BP 04/12/17 2258 123/75     Pulse Rate 04/12/17 2258 79     Resp 04/12/17 2258 18     Temp 04/12/17 2258 98.8 F (37.1 C)     Temp Source 04/12/17 2258 Oral     SpO2 04/12/17 2258 97 %     Weight 04/12/17 2254 200 lb (90.7 kg)     Height 04/12/17 2254 5\' 2"  (1.575 m)     Head Circumference --      Peak Flow --      Pain Score 04/12/17 2254 3     Pain Loc --      Pain Edu? --      Excl. in [place]? --     Constitutional: Alert and oriented. Well appearing and in Mild distress. Eyes: Conjunctivae are normal. PERRL. EOMI. Head: Atraumatic. Nose: No congestion/rhinnorhea. Mouth/Throat: Mucous membranes are moist.  Oropharynx non-erythematous. Cardiovascular: Normal rate, regular rhythm. Grossly normal heart sounds.  Good peripheral circulation. Respiratory: Normal respiratory effort.  No retractions. Lungs CTAB. Gastrointestinal: Soft with some mild suprapubic tenderness to palpation. No distention. Positive bowel sounds, right CVA tenderness to palpation Genitourinary: Normal external genitalia. White appearing thick vaginal discharge with no cervical motion tenderness, mild suprapubic tenderness to palpation with no adnexal tenderness. Musculoskeletal: No lower extremity tenderness nor edema.   Neurologic:  Normal speech and language.  Skin:  Skin is warm, dry and intact.  Psychiatric: Mood and affect are normal.   ____________________________________________   LABS (all labs ordered are listed, but only abnormal results are displayed)  Labs Reviewed  WET PREP, GENITAL - Abnormal; Notable for the following:       Result Value    Trich, Wet Prep PRESENT (*)    WBC, Wet Prep HPF POC MANY (*)    All other components within normal limits  URINALYSIS, COMPLETE (UACMP) WITH MICROSCOPIC - Abnormal; Notable for the following:    Color, Urine YELLOW (*)    APPearance CLOUDY (*)    Hgb urine dipstick LARGE (*)    Leukocytes, UA LARGE (*)    Bacteria, UA FEW (*)    Squamous Epithelial / LPF 6-30 (*)    All other components within normal limits  CHLAMYDIA/NGC RT PCR ([facility] ONLY)  PREGNANCY, URINE   ____________________________________________  EKG  none ____________________________________________  RADIOLOGY  CT renal stone study ____________________________________________   PROCEDURES  Procedure(s) performed: None  Procedures  Critical Care performed: No  ____________________________________________   INITIAL IMPRESSION / ASSESSMENT AND PLAN / ED COURSE  Pertinent labs & imaging results that were available during my care of the patient were reviewed by me and considered in my medical decision making (see  chart for details).  This is a 41 year old female who comes into the hospital today with dysuria as well as some right flank pain and vaginal discharge. The patient has a history of frequent UTIs and has had Escherichia coli grown in her urine culture in the past. The patient's Escherichia coli has been resistant to Bactrim so I feel that may be the cause of her continued and persistent symptoms. I will send the patient go for a CT scan to evaluate for possible kidney stone. I will give the patient a shot of ceftriaxone and I will reassess the patient.  Clinical Course as of Apr 13 252  Sat Apr 13, 2017  0243 Prominent right renal atrophy suggesting chronic process. No renal or ureteral stone or obstruction.   CT Renal Laren Everts [AW]    Clinical Course User Index [AW] Loney Hering, MD   The patient's wet prep came back positive for Trichomonas. The patient's CT scan did not  show any stones. I will discharge the patient to home with some Omnicef as well as some Flagyl. She should follow-up with her OB/GYN as well as with the acute care clinic.  ____________________________________________   FINAL CLINICAL IMPRESSION(S) / ED DIAGNOSES  Final diagnoses:  Dysuria  Trichomonas vaginitis  Cystitis      NEW MEDICATIONS STARTED DURING THIS VISIT:  New Prescriptions   CEFDINIR (OMNICEF) 300 MG CAPSULE    Take 1 capsule (300 mg total) by mouth 2 (two) times daily.   METRONIDAZOLE (FLAGYL) 500 MG TABLET    Take 1 tablet (500 mg total) by mouth 2 (two) times daily.     Note:  This document was prepared using Dragon voice recognition software and may include unintentional dictation errors.    Loney Hering, MD 04/13/17 [phone]

## 2017-04-29 ENCOUNTER — Emergency Department
Admission: EM | Admit: 2017-04-29 | Discharge: 2017-04-29 | Disposition: A | Discharge status: Home / Self Care | Payer: BLUE CROSS/BLUE SHIELD | Attending: Student in an Organized Health Care Education/Training Program | Admitting: Student in an Organized Health Care Education/Training Program

## 2017-04-29 ENCOUNTER — Encounter: Payer: Self-pay | Admitting: Emergency Medicine

## 2017-04-29 DIAGNOSIS — Z202 Contact with and (suspected) exposure to infections with a predominantly sexual mode of transmission: Secondary | ICD-10-CM | POA: Insufficient documentation

## 2017-04-29 DIAGNOSIS — N898 Other specified noninflammatory disorders of vagina: Secondary | ICD-10-CM | POA: Diagnosis present

## 2017-04-29 DIAGNOSIS — L298 Other pruritus: Secondary | ICD-10-CM | POA: Insufficient documentation

## 2017-04-29 DIAGNOSIS — R102 Pelvic and perineal pain: Secondary | ICD-10-CM | POA: Insufficient documentation

## 2017-04-29 LAB — WET PREP, GENITAL
Clue Cells Wet Prep HPF POC: NONE SEEN
SPERM: NONE SEEN
Trich, Wet Prep: NONE SEEN
YEAST WET PREP: NONE SEEN

## 2017-04-29 LAB — URINALYSIS, ROUTINE W REFLEX MICROSCOPIC
BACTERIA UA: NONE SEEN
BILIRUBIN URINE: NEGATIVE
Glucose, UA: NEGATIVE mg/dL
Hgb urine dipstick: NEGATIVE
Ketones, ur: NEGATIVE mg/dL
Nitrite: NEGATIVE
PH: 5 (ref 5.0–8.0)
Protein, ur: NEGATIVE mg/dL
SPECIFIC GRAVITY, URINE: 1.015 (ref 1.005–1.030)

## 2017-04-29 LAB — CHLAMYDIA/NGC RT PCR (ARMC ONLY)
CHLAMYDIA TR: NOT DETECTED
N gonorrhoeae: NOT DETECTED

## 2017-04-29 LAB — POCT PREGNANCY, URINE: Preg Test, Ur: NEGATIVE

## 2017-04-29 MED ORDER — FLUCONAZOLE 100 MG PO TABS: 100.0000 mg | ORAL_TABLET | Freq: Every day | ORAL | 0 refills | Status: AC

## 2017-04-29 MED ORDER — AZITHROMYCIN 500 MG PO TABS
1000.0000 mg | ORAL_TABLET | Freq: Once | ORAL | Status: AC
  Administered 2017-04-29: 1000 mg via ORAL
  Filled 2017-04-29: qty 2

## 2017-04-29 MED ORDER — CEFTRIAXONE SODIUM 250 MG IJ SOLR
250.0000 mg | Freq: Once | INTRAMUSCULAR | Status: AC
  Administered 2017-04-29: 250 mg via INTRAMUSCULAR
  Filled 2017-04-29: qty 250

## 2017-04-29 NOTE — ED Notes (Signed)
See triage note  States she was seen about 1 1/2 weeks ago  And dx'd with uti and trich  Was given flagyl and took entire dose  States she is still having some lower abd discomfort and vaginal itching

## 2017-04-29 NOTE — ED Triage Notes (Signed)
States vaginal itch and white drainage x 4 days. Was recently treated for UTi and trich.

## 2017-04-29 NOTE — ED Provider Notes (Addendum)
[facility] Emergency Department Provider Note   ____________________________________________   I have reviewed the triage vital signs and the nursing notes.   HISTORY  Chief Complaint Vaginal Itching    HPI Nicole Mcgee is a 41 y.o. female presents with vaginal itching and white drainage for approximately 4 days. Patient describes drainage as thin, mucus white drainage. She describes symptoms and drainage is similar to the last time she was treated for urinary tract infection and trichomonas on 5/26. Patient reports after completing all treatment, which she was compliant, she had intercourse with her husband when symptoms began again. Patient denies dysuria, urinary symptoms or hematuria. Patient denies fever, chills, headache, vision changes, chest pain, chest tightness, shortness of breath, abdominal pain, nausea and vomiting.   History reviewed. No pertinent past medical history.  There are no active problems to display for this patient.   Past Surgical History:  Procedure Laterality Date  . CESAREAN SECTION      Prior to Admission medications   Medication Sig Start Date End Date Taking? Authorizing Provider  cyclobenzaprine (FLEXERIL) 5 MG tablet Take 1 tablet (5 mg total) by mouth 3 (three) times daily as needed for muscle spasms. 12/19/15   Menshew, Dannielle Karvonen, PA-C  fluconazole (DIFLUCAN) 100 MG tablet Take 1 tablet (100 mg total) by mouth daily. 04/29/17 05/01/17  Charrie Mcconnon M, PA-C  HYDROcodone-acetaminophen (NORCO) 5-325 MG tablet Take 1 tablet by mouth every 4 (four) hours as needed for moderate pain. 12/24/15   Mortimer Fries, PA-C  hydrocortisone cream 0.5 % Apply 1 application topically 2 (two) times daily. 08/22/15   Beers, Pierce Crane, PA-C  ibuprofen (ADVIL,MOTRIN) 800 MG tablet Take 1 tablet (800 mg total) by mouth every 8 (eight) hours as needed. 08/18/15   Beers, Pierce Crane, PA-C  naproxen (EC NAPROSYN) 500 MG EC tablet Take 1 tablet  (500 mg total) by mouth 2 (two) times daily with a meal. 12/19/15   Menshew, Dannielle Karvonen, PA-C  phenazopyridine (PYRIDIUM) 200 MG tablet Take 1 tablet (200 mg total) by mouth 3 (three) times daily as needed for pain. 03/28/17   Sable Feil, PA-C  predniSONE (DELTASONE) 10 MG tablet Take 1 tablet (10 mg total) by mouth daily. 6,5,4,3,2,1 six day taper 05/10/16   Duanne Guess, PA-C  predniSONE (DELTASONE) 20 MG tablet Take 3 tablets (60 mg total) by mouth daily with breakfast. 05/30/16   Gregor Hams, MD  sulfamethoxazole-trimethoprim (BACTRIM DS,SEPTRA DS) 800-160 MG tablet Take 1 tablet by mouth 2 (two) times daily. X 7 days 05/10/16   Duanne Guess, PA-C  sulfamethoxazole-trimethoprim (BACTRIM DS,SEPTRA DS) 800-160 MG tablet Take 1 tablet by mouth 2 (two) times daily. 03/28/17   Sable Feil, PA-C    Allergies Patient has no known allergies.  No family history on file.  Social History Social History  Substance Use Topics  . Smoking status: Never Smoker  . Smokeless tobacco: Never Used  . Alcohol use No    Review of Systems Constitutional: Negative for fever/chills Eyes: No visual changes. ENT:  Negative for sore throat and for difficulty swallowing Cardiovascular: Denies chest pain. Respiratory: Denies cough Denies shortness of breath. Gastrointestinal: No abdominal pain.  No nausea, vomiting, diarrhea. Genitourinary: Positive for lower pelvic pain, vaginal itching, vaginal discharge appearing thin and white.  Musculoskeletal: Negative for back pain. Negative for generalized body aches. Skin: Negative for rash. Neurological: Negative for headaches.    ____________________________________________   PHYSICAL EXAM:  VITAL SIGNS: ED Triage Vitals [04/29/17 0928]  Enc Vitals Group     BP 139/81     Pulse Rate 83     Resp 18     Temp 98.5 F (36.9 C)     Temp Source Oral     SpO2 94 %     Weight 220 lb (99.8 kg)     Height 5\' 2"  (1.575 m)     Head  Circumference      Peak Flow      Pain Score      Pain Loc      Pain Edu?      Excl. in [place]?     Constitutional: Alert and oriented. Well appearing and in no acute distress.  Head: Normocephalic and atraumatic. Eyes: Conjunctivae are normal.  Cardiovascular: Normal rate, regular rhythm. Normal distal pulses. Respiratory: Normal respiratory effort. Gastrointestinal: Soft and nontender. No distention. Genitourinary: Vaginal discharge white, thin. Vaginal wall intact, non-irritated. Bimanual exam unremarkable.  Musculoskeletal: Nontender with normal range of motion in all extremities. Neurologic: Normal speech and language.  Skin:  Skin is warm, dry and intact. No rash noted. Psychiatric: Mood and affect are normal.  ____________________________________________   LABS (all labs ordered are listed, but only abnormal results are displayed)  Labs Reviewed  WET PREP, GENITAL - Abnormal; Notable for the following:       Result Value   WBC, Wet Prep HPF POC FEW (*)    All other components within normal limits  URINALYSIS, ROUTINE W REFLEX MICROSCOPIC - Abnormal; Notable for the following:    Color, Urine YELLOW (*)    APPearance HAZY (*)    Leukocytes, UA MODERATE (*)    Squamous Epithelial / LPF 6-30 (*)    All other components within normal limits  CHLAMYDIA/NGC RT PCR ([facility] ONLY)  POC URINE PREG, ED  POCT PREGNANCY, URINE   ____________________________________________  EKG None ____________________________________________  RADIOLOGY None ____________________________________________   PROCEDURES  Procedure(s) performed: no    Critical Care performed: no ____________________________________________   INITIAL IMPRESSION / ASSESSMENT AND PLAN / ED COURSE  Pertinent labs & imaging results that were available during my care of the patient were reviewed by me and considered in my medical decision making (see chart for details).  Patient presented with  vaginal irritation and drainage, lower pelvic pain for approximately 4 days. Discussed treatment options with patient, she elected to be treated prophylactically prior to being discharged as opposed to awaiting lab results. Labs are pending and patient was prophylactically treated for chlamydia and gonorrhea receiving ceftriaxone 250 IM and azithromycin 1 g oral. Patient also provided a prescription for fluconazole for candidiasis vaginitis. Physical exam and vital signs were reassuring.  Patient  informed of clinical course, understand medical decision-making process, and agree with plan. Patient was advised to follow up with PCP as needed and was also advised to return to the emergency department for symptoms that change or worsen.      ____________________________________________   FINAL CLINICAL IMPRESSION(S) / ED DIAGNOSES  Final diagnoses:  Possible exposure to STD  Vaginal itching       NEW MEDICATIONS STARTED DURING THIS VISIT:  Discharge Medication List as of 04/29/2017 12:00 PM       Note:  This document was prepared using Dragon voice recognition software and may include unintentional dictation errors.    Alric Quan 04/29/17 1542    Merlyn Lot, MD 04/29/17 [place], PA-C 04/29/17  1658    Merlyn Lot, MD 04/30/17 [phone]

## 2017-12-04 ENCOUNTER — Emergency Department
Admission: EM | Admit: 2017-12-04 | Discharge: 2017-12-04 | Disposition: A | Discharge status: Home / Self Care | Payer: BLUE CROSS/BLUE SHIELD | Attending: Emergency Medicine | Admitting: Emergency Medicine

## 2017-12-04 ENCOUNTER — Other Ambulatory Visit: Payer: Self-pay

## 2017-12-04 ENCOUNTER — Encounter: Payer: Self-pay | Admitting: *Deleted

## 2017-12-04 DIAGNOSIS — N390 Urinary tract infection, site not specified: Secondary | ICD-10-CM

## 2017-12-04 DIAGNOSIS — R350 Frequency of micturition: Secondary | ICD-10-CM | POA: Insufficient documentation

## 2017-12-04 DIAGNOSIS — Z79899 Other long term (current) drug therapy: Secondary | ICD-10-CM | POA: Insufficient documentation

## 2017-12-04 DIAGNOSIS — B9689 Other specified bacterial agents as the cause of diseases classified elsewhere: Secondary | ICD-10-CM

## 2017-12-04 DIAGNOSIS — N760 Acute vaginitis: Secondary | ICD-10-CM | POA: Insufficient documentation

## 2017-12-04 DIAGNOSIS — R300 Dysuria: Secondary | ICD-10-CM | POA: Diagnosis not present

## 2017-12-04 DIAGNOSIS — N898 Other specified noninflammatory disorders of vagina: Secondary | ICD-10-CM | POA: Diagnosis present

## 2017-12-04 LAB — URINALYSIS, COMPLETE (UACMP) WITH MICROSCOPIC
BILIRUBIN URINE: NEGATIVE
GLUCOSE, UA: NEGATIVE mg/dL
KETONES UR: NEGATIVE mg/dL
NITRITE: NEGATIVE
PROTEIN: NEGATIVE mg/dL
Specific Gravity, Urine: 1.013 (ref 1.005–1.030)
pH: 7 (ref 5.0–8.0)

## 2017-12-04 LAB — WET PREP, GENITAL
SPERM: NONE SEEN
Trich, Wet Prep: NONE SEEN
Yeast Wet Prep HPF POC: NONE SEEN

## 2017-12-04 LAB — POCT PREGNANCY, URINE: Preg Test, Ur: NEGATIVE

## 2017-12-04 LAB — COMPREHENSIVE METABOLIC PANEL
ALK PHOS: 72 U/L (ref 38–126)
ALT: 13 U/L — ABNORMAL LOW (ref 14–54)
ANION GAP: 8 (ref 5–15)
AST: 17 U/L (ref 15–41)
Albumin: 3.7 g/dL (ref 3.5–5.0)
BUN: 14 mg/dL (ref 6–20)
CALCIUM: 8.8 mg/dL — AB (ref 8.9–10.3)
CO2: 26 mmol/L (ref 22–32)
Chloride: 105 mmol/L (ref 101–111)
Creatinine, Ser: 0.9 mg/dL (ref 0.44–1.00)
GFR calc non Af Amer: >60 mL/min (ref >60)
Glucose, Bld: 84 mg/dL (ref 65–99)
Potassium: 3.8 mmol/L (ref 3.5–5.1)
SODIUM: 139 mmol/L (ref 135–145)
Total Bilirubin: 0.6 mg/dL (ref 0.3–1.2)
Total Protein: 7.6 g/dL (ref 6.5–8.1)

## 2017-12-04 LAB — CBC
HCT: 40 % (ref 35.0–47.0)
HEMOGLOBIN: 13.2 g/dL (ref 12.0–16.0)
MCH: 30.1 pg (ref 26.0–34.0)
MCHC: 33.1 g/dL (ref 32.0–36.0)
MCV: 90.9 fL (ref 80.0–100.0)
Platelets: 292 10*3/uL (ref 150–440)
RBC: 4.39 MIL/uL (ref 3.80–5.20)
RDW: 13.5 % (ref 11.5–14.5)
WBC: 10.1 10*3/uL (ref 3.6–11.0)

## 2017-12-04 LAB — CHLAMYDIA/NGC RT PCR (ARMC ONLY)
Chlamydia Tr: NOT DETECTED
N gonorrhoeae: NOT DETECTED

## 2017-12-04 MED ORDER — METRONIDAZOLE 500 MG PO TABS
500.0000 mg | ORAL_TABLET | Freq: Once | ORAL | Status: AC
  Administered 2017-12-04: 500 mg via ORAL
  Filled 2017-12-04: qty 1

## 2017-12-04 MED ORDER — METRONIDAZOLE 500 MG PO TABS: 500.0000 mg | ORAL_TABLET | Freq: Two times a day (BID) | ORAL | 0 refills | Status: AC

## 2017-12-04 MED ORDER — CEPHALEXIN 500 MG PO CAPS: 500.0000 mg | ORAL_CAPSULE | Freq: Three times a day (TID) | ORAL | 0 refills | Status: AC

## 2017-12-04 MED ORDER — FLUCONAZOLE 150 MG PO TABS: 150.0000 mg | ORAL_TABLET | ORAL | 0 refills | Status: DC

## 2017-12-04 MED ORDER — CEPHALEXIN 500 MG PO CAPS
500.0000 mg | ORAL_CAPSULE | Freq: Once | ORAL | Status: AC
  Administered 2017-12-04: 500 mg via ORAL
  Filled 2017-12-04: qty 1

## 2017-12-04 NOTE — ED Notes (Signed)
Pt presents to ED with vaginal itching and irritation in addition to urinary frequency and pain with strong odor noted by pt. Symptoms started about a week ago and have gotten worse the past 2 days. Pt reports having a hx of frequent UTI's.  Has not been seem for presenting symptoms. Denies pelvic pain or n/v/d.

## 2017-12-04 NOTE — ED Triage Notes (Signed)
Pt with vaginal irritation and dysuria and urinary frequency.  Sx for 3-4 days.  Intermittent low abd pain.  Pt alert

## 2017-12-04 NOTE — ED Provider Notes (Signed)
[facility] Emergency Department Provider Note  ____________________________________________   First MD Initiated Contact with Patient 12/04/17 1858     (approximate)  I have reviewed the triage vital signs and the nursing notes.   HISTORY  Chief Complaint Vaginitis and Dysuria   HPI Nicole Mcgee is a 42 y.o. female strip trichomonas was presented to the emergency department 2 weeks of vaginal irritation as well as residue on the toilet paper when she wiped after urinating.  Says that she is also having urinary frequency.  Denies any vaginal discharge or bleeding.  Says that she contracted trichomonas from her husband who she is still sexually active with.  No past medical history on file.  There are no active problems to display for this patient.   Past Surgical History:  Procedure Laterality Date  . CESAREAN SECTION      Prior to Admission medications   Medication Sig Start Date End Date Taking? Authorizing Provider  cyclobenzaprine (FLEXERIL) 5 MG tablet Take 1 tablet (5 mg total) by mouth 3 (three) times daily as needed for muscle spasms. 12/19/15   Menshew, Dannielle Karvonen, PA-C  HYDROcodone-acetaminophen (NORCO) 5-325 MG tablet Take 1 tablet by mouth every 4 (four) hours as needed for moderate pain. 12/24/15   Mortimer Fries, PA-C  hydrocortisone cream 0.5 % Apply 1 application topically 2 (two) times daily. 08/22/15   Beers, Pierce Crane, PA-C  ibuprofen (ADVIL,MOTRIN) 800 MG tablet Take 1 tablet (800 mg total) by mouth every 8 (eight) hours as needed. 08/18/15   Beers, Pierce Crane, PA-C  naproxen (EC NAPROSYN) 500 MG EC tablet Take 1 tablet (500 mg total) by mouth 2 (two) times daily with a meal. 12/19/15   Menshew, Dannielle Karvonen, PA-C  phenazopyridine (PYRIDIUM) 200 MG tablet Take 1 tablet (200 mg total) by mouth 3 (three) times daily as needed for pain. 03/28/17   Sable Feil, PA-C  predniSONE (DELTASONE) 10 MG tablet Take 1 tablet (10 mg total) by  mouth daily. 6,5,4,3,2,1 six day taper 05/10/16   Duanne Guess, PA-C  predniSONE (DELTASONE) 20 MG tablet Take 3 tablets (60 mg total) by mouth daily with breakfast. 05/30/16   Gregor Hams, MD  sulfamethoxazole-trimethoprim (BACTRIM DS,SEPTRA DS) 800-160 MG tablet Take 1 tablet by mouth 2 (two) times daily. X 7 days 05/10/16   Duanne Guess, PA-C  sulfamethoxazole-trimethoprim (BACTRIM DS,SEPTRA DS) 800-160 MG tablet Take 1 tablet by mouth 2 (two) times daily. 03/28/17   Sable Feil, PA-C    Allergies Patient has no known allergies.  No family history on file.  Social History Social History   Tobacco Use  . Smoking status: Never Smoker  . Smokeless tobacco: Never Used  Substance Use Topics  . Alcohol use: No  . Drug use: No    Review of Systems  Constitutional: No fever/chills Eyes: No visual changes. ENT: No sore throat. Cardiovascular: Denies chest pain. Respiratory: Denies shortness of breath. Gastrointestinal: No abdominal pain.  No nausea, no vomiting.  No diarrhea.  No constipation. Genitourinary: Negative for dysuria. Musculoskeletal: Negative for back pain. Skin: Negative for rash. Neurological: Negative for headaches, focal weakness or numbness.   ____________________________________________   PHYSICAL EXAM:  VITAL SIGNS: ED Triage Vitals  Enc Vitals Group     BP 12/04/17 1643 (!) 154/80     Pulse Rate 12/04/17 1643 69     Resp 12/04/17 1643 18     Temp 12/04/17 1643 98.9 F (37.2 C)  Temp Source 12/04/17 1643 Oral     SpO2 12/04/17 1643 99 %     Weight 12/04/17 1643 201 lb (91.2 kg)     Height 12/04/17 1643 5\' 2"  (1.575 m)     Head Circumference --      Peak Flow --      Pain Score 12/04/17 1921 0     Pain Loc --      Pain Edu? --      Excl. in [place]? --     Constitutional: Alert and oriented. Well appearing and in no acute distress. Eyes: Conjunctivae are normal.  Head: Atraumatic. Nose: No congestion/rhinnorhea. Mouth/Throat:  Mucous membranes are moist.  Neck: No stridor.   Cardiovascular: Normal rate, regular rhythm. Grossly normal heart sounds.   Respiratory: Normal respiratory effort.  No retractions. Lungs CTAB. Gastrointestinal: Soft and nontender. No distention.  Genitourinary: Small amount of white residue overlying the labia minora.  Speculum exam with a fishy smelling clear and chunky discharge.  Bimanual exam without CMT, uterine or adnexal tenderness to palpation.  No masses palpated. Musculoskeletal: No lower extremity tenderness nor edema.  No joint effusions. Neurologic:  Normal speech and language. No gross focal neurologic deficits are appreciated. Skin:  Skin is warm, dry and intact. No rash noted. Psychiatric: Mood and affect are normal. Speech and behavior are normal.  ____________________________________________   LABS (all labs ordered are listed, but only abnormal results are displayed)  Labs Reviewed  WET PREP, GENITAL - Abnormal; Notable for the following components:      Result Value   Clue Cells Wet Prep HPF POC PRESENT (*)    WBC, Wet Prep HPF POC FEW (*)    All other components within normal limits  COMPREHENSIVE METABOLIC PANEL - Abnormal; Notable for the following components:   Calcium 8.8 (*)    ALT 13 (*)    All other components within normal limits  URINALYSIS, COMPLETE (UACMP) WITH MICROSCOPIC - Abnormal; Notable for the following components:   Color, Urine YELLOW (*)    APPearance CLOUDY (*)    Hgb urine dipstick SMALL (*)    Leukocytes, UA LARGE (*)    Bacteria, UA FEW (*)    Squamous Epithelial / LPF 6-30 (*)    All other components within normal limits  CHLAMYDIA/NGC RT PCR ([facility] ONLY)  URINE CULTURE  CBC  POC URINE PREG, ED  POCT PREGNANCY, URINE   ____________________________________________  EKG   ____________________________________________  RADIOLOGY   ____________________________________________   PROCEDURES  Procedure(s)  performed:   Procedures  Critical Care performed:   ____________________________________________   INITIAL IMPRESSION / ASSESSMENT AND PLAN / ED COURSE  Pertinent labs & imaging results that were available during my care of the patient were reviewed by me and considered in my medical decision making (see chart for details).  DDX: Bacterial vaginosis, pelvic inflammatory disease, chlamydia, trichomonas, yeast infection As part of my medical decision making, I reviewed the following data within the electronic MEDICAL RECORD NUMBER Notes from prior ED visits     ----------------------------------------- 8:54 PM on 12/04/2017 -----------------------------------------  Patient appears to have UTI along with bacterial vaginosis.  Will prescribe Keflex as well as Flagyl.  Patient reports that she gets yeast infections when she takes antibiotics.  She is requesting to Diflucan tablets I will also prescribe.  Patient is understanding of the diagnosis as well as the treatment plan and willing to comply.  ____________________________________________   FINAL CLINICAL IMPRESSION(S) / ED DIAGNOSES  Bacterial vaginosis.  UTI.    NEW MEDICATIONS STARTED DURING THIS VISIT:  New Prescriptions   No medications on file     Note:  This document was prepared using Dragon voice recognition software and may include unintentional dictation errors.     Orbie Pyo, MD 12/04/17 2055

## 2017-12-07 LAB — URINE CULTURE

## 2017-12-08 NOTE — Progress Notes (Signed)
ED Antimicrobial Stewardship Positive Culture Follow Up   Nicole Mcgee is an 42 y.o. female who presented to [facility] on 12/04/2017 with a chief complaint of UTI and bacterial vaginosis.   Chief Complaint  Patient presents with  . Vaginitis  . Dysuria    Recent Results (from the past 720 hour(s))  Urine Culture     Status: Abnormal   Collection Time: 12/04/17  4:52 PM  Result Value Ref Range Status   Specimen Description   Final    URINE, RANDOM Performed at [facility], [address] [place], [place] [address]    Special Requests   Final    NONE Performed at [facility], [address] [place], [place] [address]    Culture (A)  Final    >=100,000 COLONIES/mL ESCHERICHIA COLI Confirmed Extended Spectrum Beta-Lactamase Producer (ESBL).  In bloodstream infections from ESBL organisms, carbapenems are preferred over piperacillin/tazobactam. They are shown to have a lower risk of mortality. Performed at [facility], [place] [address], [place], [place] [address]    Report Status 12/07/2017 FINAL  Final   Organism ID, Bacteria ESCHERICHIA COLI (A)  Final      Susceptibility   Escherichia coli - MIC*    AMPICILLIN >=32 RESISTANT Resistant     CEFAZOLIN >=64 RESISTANT Resistant     CEFTRIAXONE >=64 RESISTANT Resistant     CIPROFLOXACIN >=4 RESISTANT Resistant     GENTAMICIN <=1 SENSITIVE Sensitive     IMIPENEM <=0.25 SENSITIVE Sensitive     NITROFURANTOIN <=16 SENSITIVE Sensitive     TRIMETH/SULFA >=320 RESISTANT Resistant     AMPICILLIN/SULBACTAM 4 SENSITIVE Sensitive     PIP/TAZO <=4 SENSITIVE Sensitive     Extended ESBL POSITIVE Resistant     * >=100,000 COLONIES/mL ESCHERICHIA COLI  Chlamydia/NGC rt PCR ([facility] only)     Status: None   Collection Time: 12/04/17  8:09 PM  Result Value Ref Range Status   Specimen source GC/Chlam ENDOCERVICAL  Final   Chlamydia Tr NOT DETECTED NOT DETECTED Final   N gonorrhoeae NOT DETECTED NOT DETECTED Final     Comment: (NOTE) 100  This methodology has not been evaluated in pregnant women or in 200  patients with a history of hysterectomy. 300 400  This methodology will not be performed on patients less than 95  years of age. Performed at [facility], [address] [place], [place] [address]   Wet prep, genital     Status: Abnormal   Collection Time: 12/04/17  8:09 PM  Result Value Ref Range Status   Yeast Wet Prep HPF POC NONE SEEN NONE SEEN Final   Trich, Wet Prep NONE SEEN NONE SEEN Final   Clue Cells Wet Prep HPF POC PRESENT (A) NONE SEEN Final   WBC, Wet Prep HPF POC FEW (A) NONE SEEN Final   Sperm NONE SEEN  Final    Comment: Performed at [facility], [address] [place],  [address]    [x]  Treated with Keflex and Flagyl, organism resistant to prescribed Keflex []  Patient discharged originally without antimicrobial agent and treatment is now indicated  New antibiotic prescription: Fosfomycin 3g once   ED Provider: Dr. Russella Dar    Talked to patient--will stop taking Keflex and continue taking Flagyl. Called new prescription for Fosfomycin in to [facility] on [address].    Candelaria Stagers , PharmD Pharmacy Resident  12/08/2017, 3:05 PM

## 2018-03-19 ENCOUNTER — Emergency Department
Admission: EM | Admit: 2018-03-19 | Discharge: 2018-03-19 | Disposition: A | Discharge status: Home / Self Care | Payer: BLUE CROSS/BLUE SHIELD | Attending: Emergency Medicine | Admitting: Emergency Medicine

## 2018-03-19 ENCOUNTER — Encounter: Payer: Self-pay | Admitting: *Deleted

## 2018-03-19 ENCOUNTER — Emergency Department: Payer: BLUE CROSS/BLUE SHIELD

## 2018-03-19 ENCOUNTER — Other Ambulatory Visit: Payer: Self-pay

## 2018-03-19 DIAGNOSIS — J069 Acute upper respiratory infection, unspecified: Secondary | ICD-10-CM | POA: Insufficient documentation

## 2018-03-19 DIAGNOSIS — B9789 Other viral agents as the cause of diseases classified elsewhere: Secondary | ICD-10-CM

## 2018-03-19 DIAGNOSIS — R05 Cough: Secondary | ICD-10-CM | POA: Diagnosis present

## 2018-03-19 MED ORDER — HYDROCOD POLST-CPM POLST ER 10-8 MG/5ML PO SUER: 5.0000 mL | Freq: Every evening | ORAL | 0 refills | Status: DC | PRN

## 2018-03-19 MED ORDER — PSEUDOEPHEDRINE HCL ER 120 MG PO TB12: 120.0000 mg | ORAL_TABLET | Freq: Two times a day (BID) | ORAL | 2 refills | Status: AC | PRN

## 2018-03-19 MED ORDER — BENZONATATE 100 MG PO CAPS: 200.0000 mg | ORAL_CAPSULE | Freq: Three times a day (TID) | ORAL | 0 refills | Status: AC | PRN

## 2018-03-19 NOTE — ED Provider Notes (Signed)
[facility] Emergency Department Provider Note   ____________________________________________   First MD Initiated Contact with Patient 03/19/18 1324     (approximate)  I have reviewed the triage vital signs and the nursing notes.   HISTORY  Chief Complaint Cough and Fever    HPI Nicole Mcgee is a 42 y.o. female patient complain of productive cough and fever for 1 month.  Patient states she was given amoxicillin by walk-in clinic last week.  Patient no relief of medication.  Basic cough is productive of green phlegm.  Patient had a fever 102 days morning and took extra strength Tylenol 08 30.  Patient denies nausea, vomiting, diarrhea.  History reviewed. No pertinent past medical history.  There are no active problems to display for this patient.   Past Surgical History:  Procedure Laterality Date  . CESAREAN SECTION      Prior to Admission medications   Medication Sig Start Date End Date Taking? Authorizing Provider  benzonatate (TESSALON PERLES) 100 MG capsule Take 2 capsules (200 mg total) by mouth 3 (three) times daily as needed. 03/19/18 03/19/19  Sable Feil, PA-C  chlorpheniramine-HYDROcodone (TUSSIONEX PENNKINETIC ER) 10-8 MG/5ML SUER Take 5 mLs by mouth at bedtime as needed for cough. 03/19/18   Sable Feil, PA-C  cyclobenzaprine (FLEXERIL) 5 MG tablet Take 1 tablet (5 mg total) by mouth 3 (three) times daily as needed for muscle spasms. 12/19/15   Menshew, Dannielle Karvonen, PA-C  fluconazole (DIFLUCAN) 150 MG tablet Take 1 tablet (150 mg total) by mouth once a week. As needed for yeast infection 12/04/17   Schaevitz, Randall An, MD  HYDROcodone-acetaminophen (NORCO) 5-325 MG tablet Take 1 tablet by mouth every 4 (four) hours as needed for moderate pain. 12/24/15   Mortimer Fries, PA-C  hydrocortisone cream 0.5 % Apply 1 application topically 2 (two) times daily. 08/22/15   Beers, Pierce Crane, PA-C  ibuprofen (ADVIL,MOTRIN) 800 MG tablet Take  1 tablet (800 mg total) by mouth every 8 (eight) hours as needed. 08/18/15   Beers, Pierce Crane, PA-C  naproxen (EC NAPROSYN) 500 MG EC tablet Take 1 tablet (500 mg total) by mouth 2 (two) times daily with a meal. 12/19/15   Menshew, Dannielle Karvonen, PA-C  phenazopyridine (PYRIDIUM) 200 MG tablet Take 1 tablet (200 mg total) by mouth 3 (three) times daily as needed for pain. 03/28/17   Sable Feil, PA-C  predniSONE (DELTASONE) 10 MG tablet Take 1 tablet (10 mg total) by mouth daily. 6,5,4,3,2,1 six day taper 05/10/16   Duanne Guess, PA-C  predniSONE (DELTASONE) 20 MG tablet Take 3 tablets (60 mg total) by mouth daily with breakfast. 05/30/16   Gregor Hams, MD  pseudoephedrine (SUDAFED) 120 MG 12 hr tablet Take 1 tablet (120 mg total) by mouth 2 (two) times daily as needed for congestion. 03/19/18 03/19/19  Sable Feil, PA-C  sulfamethoxazole-trimethoprim (BACTRIM DS,SEPTRA DS) 800-160 MG tablet Take 1 tablet by mouth 2 (two) times daily. X 7 days 05/10/16   Duanne Guess, PA-C  sulfamethoxazole-trimethoprim (BACTRIM DS,SEPTRA DS) 800-160 MG tablet Take 1 tablet by mouth 2 (two) times daily. 03/28/17   Sable Feil, PA-C    Allergies Patient has no known allergies.  History reviewed. No pertinent family history.  Social History Social History   Tobacco Use  . Smoking status: Never Smoker  . Smokeless tobacco: Never Used  Substance Use Topics  . Alcohol use: No  . Drug use: No  Review of Systems  Constitutional: No fever/chills Eyes: No visual changes. ENT: No sore throat.  Nasal congestion. Cardiovascular: Denies chest pain. Respiratory productive cough gastrointestinal: No abdominal pain.  No nausea, no vomiting.  No diarrhea.  No constipation. Genitourinary: Negative for dysuria. Musculoskeletal: Negative for back pain. Skin: Negative for rash. Neurological: Negative for headaches, focal weakness or  numbness.   ____________________________________________   PHYSICAL EXAM:  VITAL SIGNS: ED Triage Vitals  Enc Vitals Group     BP 03/19/18 1308 125/80     Pulse Rate 03/19/18 1308 88     Resp 03/19/18 1308 16     Temp 03/19/18 1308 98.9 F (37.2 C)     Temp Source 03/19/18 1308 Oral     SpO2 03/19/18 1308 99 %     Weight 03/19/18 1309 219 lb (99.3 kg)     Height 03/19/18 1309 5\' 2"  (1.575 m)     Head Circumference --      Peak Flow --      Pain Score 03/19/18 1309 6     Pain Loc --      Pain Edu? --      Excl. in [place]? --     Constitutional: Alert and oriented. Well appearing and in no acute distress. Mouth/Throat: Mucous membranes are moist.  Oropharynx non-erythematous. Neck: No stridor.  Hematological/Lymphatic/Immunilogical: No cervical lymphadenopathy. Cardiovascular: Normal rate, regular rhythm. Grossly normal heart sounds.  Good peripheral circulation. Respiratory: Normal respiratory effort.  No retractions. Lungs CTAB. Gastrointestinal: Soft and nontender. No distention. No abdominal bruits. No CVA tenderness. Musculoskeletal: No lower extremity tenderness nor edema.  No joint effusions. Neurologic:  Normal speech and language. No gross focal neurologic deficits are appreciated. No gait instability. Skin:  Skin is warm, dry and intact. No rash noted. Psychiatric: Mood and affect are normal. Speech and behavior are normal.  ____________________________________________   LABS (all labs ordered are listed, but only abnormal results are displayed)  Labs Reviewed - No data to display ____________________________________________  EKG   ____________________________________________  RADIOLOGY  ED MD interpretation:    Official radiology report(s): Dg Chest 2 View  Result Date: 03/19/2018 CLINICAL DATA:  Productive cough and fever for 1 month. EXAM: CHEST - 2 VIEW COMPARISON:  07/16/2015 FINDINGS: The heart size and mediastinal contours are within normal limits.  Both lungs are clear. The visualized skeletal structures are unremarkable. IMPRESSION: Stable exam.  No active cardiopulmonary disease. Electronically Signed   By: Earle Gell M.D.   On: 03/19/2018 13:28    ____________________________________________   PROCEDURES  Procedure(s) performed: None  Procedures  Critical Care performed: No  ____________________________________________   INITIAL IMPRESSION / ASSESSMENT AND PLAN / ED COURSE  As part of my medical decision making, I reviewed the following data within the [place]    Patient presents with 1 month of intermittent productive nonproductive cough.  Patient states she was on response to antibiotics 1 month ago.  Discussed checks x-ray findings with patient which was unremarkable.  Patient given discharge care instructions and advised to follow-up with PCP.  Take medication as directed.      ____________________________________________   FINAL CLINICAL IMPRESSION(S) / ED DIAGNOSES  Final diagnoses:  Viral URI with cough     ED Discharge Orders        Ordered    pseudoephedrine (SUDAFED) 120 MG 12 hr tablet  2 times daily PRN     03/19/18 1338    benzonatate (TESSALON PERLES) 100 MG capsule  3 times daily PRN     03/19/18 1338    chlorpheniramine-HYDROcodone (TUSSIONEX PENNKINETIC ER) 10-8 MG/5ML SUER  At bedtime PRN     03/19/18 1338       Note:  This document was prepared using Dragon voice recognition software and may include unintentional dictation errors.    Sable Feil, PA-C 03/19/18 1345    Schuyler Amor, MD 03/19/18 [phone]

## 2018-03-19 NOTE — ED Triage Notes (Signed)
Pt to ED rrpotrating cough and fever for the past month. Pt was given amoxicillin by the walk in clinic that pt reports did not help. Cough persists per pt with green phlem.   Pt reports fever of 102 this morning that she took tylenol at 830.

## 2018-03-19 NOTE — Discharge Instructions (Addendum)
Follow discharge care instruction take medication as directed. °

## 2018-10-31 ENCOUNTER — Other Ambulatory Visit: Payer: Self-pay

## 2018-10-31 ENCOUNTER — Emergency Department
Admission: EM | Admit: 2018-10-31 | Discharge: 2018-10-31 | Disposition: A | Discharge status: Home / Self Care | Payer: BLUE CROSS/BLUE SHIELD | Attending: Emergency Medicine | Admitting: Emergency Medicine

## 2018-10-31 ENCOUNTER — Encounter: Payer: Self-pay | Admitting: Emergency Medicine

## 2018-10-31 DIAGNOSIS — T7840XA Allergy, unspecified, initial encounter: Secondary | ICD-10-CM | POA: Insufficient documentation

## 2018-10-31 DIAGNOSIS — L509 Urticaria, unspecified: Secondary | ICD-10-CM | POA: Diagnosis not present

## 2018-10-31 MED ORDER — DIPHENHYDRAMINE HCL 25 MG PO CAPS
ORAL_CAPSULE | ORAL | Status: AC
  Administered 2018-10-31: 50 mg via ORAL
  Filled 2018-10-31: qty 2

## 2018-10-31 MED ORDER — PREDNISONE 20 MG PO TABS
60.0000 mg | ORAL_TABLET | Freq: Once | ORAL | Status: AC
  Administered 2018-10-31: 60 mg via ORAL
  Filled 2018-10-31: qty 3

## 2018-10-31 MED ORDER — DIPHENHYDRAMINE HCL 25 MG PO CAPS
50.0000 mg | ORAL_CAPSULE | Freq: Once | ORAL | Status: AC
  Administered 2018-10-31: 50 mg via ORAL
  Filled 2018-10-31: qty 2

## 2018-10-31 MED ORDER — PREDNISONE 20 MG PO TABS: 40.0000 mg | ORAL_TABLET | Freq: Every day | ORAL | 0 refills | Status: AC

## 2018-10-31 MED ORDER — DIPHENHYDRAMINE HCL 25 MG PO CAPS
50.0000 mg | ORAL_CAPSULE | Freq: Once | ORAL | Status: AC
  Administered 2018-10-31: 50 mg via ORAL

## 2018-10-31 NOTE — ED Triage Notes (Signed)
Patient ambulatory to triage with steady gait, without difficulty or distress noted; pt reports x wk having itching to hands/arms; st hx of same with unknown rx; took no meds PTA

## 2018-10-31 NOTE — ED Provider Notes (Signed)
[facility] Emergency Department Provider Note   First MD Initiated Contact with Patient 10/31/18 [phone]     (approximate)  I have reviewed the triage vital signs and the nursing notes.   HISTORY  Chief Complaint Allergic Reaction    HPI Nicole Mcgee is a 42 y.o. female presents to the emergency department with a one-week history of bilateral upper extremity rash that is pruritic following being stung by an insect on her left thigh.  Patient admits to a previous episode of a similar reaction.  Patient states that she has never seen an allergist to date.  Patient denies taking any medications at home to stop pruritus.  Patient denies any difficulty breathing or swallowing.   Past medical history Allergic reaction There are no active problems to display for this patient.   Past Surgical History:  Procedure Laterality Date  . CESAREAN SECTION      Prior to Admission medications   Medication Sig Start Date End Date Taking? Authorizing Provider  benzonatate (TESSALON PERLES) 100 MG capsule Take 2 capsules (200 mg total) by mouth 3 (three) times daily as needed. 03/19/18 03/19/19  Sable Feil, PA-C  chlorpheniramine-HYDROcodone (TUSSIONEX PENNKINETIC ER) 10-8 MG/5ML SUER Take 5 mLs by mouth at bedtime as needed for cough. 03/19/18   Sable Feil, PA-C  cyclobenzaprine (FLEXERIL) 5 MG tablet Take 1 tablet (5 mg total) by mouth 3 (three) times daily as needed for muscle spasms. 12/19/15   Menshew, Dannielle Karvonen, PA-C  fluconazole (DIFLUCAN) 150 MG tablet Take 1 tablet (150 mg total) by mouth once a week. As needed for yeast infection 12/04/17   Schaevitz, Randall An, MD  HYDROcodone-acetaminophen (NORCO) 5-325 MG tablet Take 1 tablet by mouth every 4 (four) hours as needed for moderate pain. 12/24/15   Mortimer Fries, PA-C  hydrocortisone cream 0.5 % Apply 1 application topically 2 (two) times daily. 08/22/15   Beers, Pierce Crane, PA-C  ibuprofen (ADVIL,MOTRIN)  800 MG tablet Take 1 tablet (800 mg total) by mouth every 8 (eight) hours as needed. 08/18/15   Beers, Pierce Crane, PA-C  naproxen (EC NAPROSYN) 500 MG EC tablet Take 1 tablet (500 mg total) by mouth 2 (two) times daily with a meal. 12/19/15   Menshew, Dannielle Karvonen, PA-C  phenazopyridine (PYRIDIUM) 200 MG tablet Take 1 tablet (200 mg total) by mouth 3 (three) times daily as needed for pain. 03/28/17   Sable Feil, PA-C  predniSONE (DELTASONE) 10 MG tablet Take 1 tablet (10 mg total) by mouth daily. 6,5,4,3,2,1 six day taper 05/10/16   Duanne Guess, PA-C  predniSONE (DELTASONE) 20 MG tablet Take 3 tablets (60 mg total) by mouth daily with breakfast. 05/30/16   Gregor Hams, MD  pseudoephedrine (SUDAFED) 120 MG 12 hr tablet Take 1 tablet (120 mg total) by mouth 2 (two) times daily as needed for congestion. 03/19/18 03/19/19  Sable Feil, PA-C  sulfamethoxazole-trimethoprim (BACTRIM DS,SEPTRA DS) 800-160 MG tablet Take 1 tablet by mouth 2 (two) times daily. X 7 days 05/10/16   Duanne Guess, PA-C  sulfamethoxazole-trimethoprim (BACTRIM DS,SEPTRA DS) 800-160 MG tablet Take 1 tablet by mouth 2 (two) times daily. 03/28/17   Sable Feil, PA-C    Allergies Patient has no known allergies.  No family history on file.  Social History Social History   Tobacco Use  . Smoking status: Never Smoker  . Smokeless tobacco: Never Used  Substance Use Topics  . Alcohol use: No  .  Drug use: No    Review of Systems Constitutional: No fever/chills Eyes: No visual changes. ENT: No sore throat. Cardiovascular: Denies chest pain. Respiratory: Denies shortness of breath. Gastrointestinal: No abdominal pain.  No nausea, no vomiting.  No diarrhea.  No constipation. Genitourinary: Negative for dysuria. Musculoskeletal: Negative for neck pain.  Negative for back pain. Integumentary: Positive for bilateral upper extremity rash Neurological: Negative for headaches, focal weakness or  numbness.   ____________________________________________   PHYSICAL EXAM:  VITAL SIGNS: ED Triage Vitals  Enc Vitals Group     BP 10/31/18 0033 121/68     Pulse Rate 10/31/18 0033 71     Resp 10/31/18 0033 18     Temp 10/31/18 0033 98.6 F (37 C)     Temp Source 10/31/18 0033 Oral     SpO2 10/31/18 0033 99 %     Weight 10/31/18 0021 99.8 kg (220 lb)     Height 10/31/18 0021 1.575 m (5\' 2" )     Head Circumference --      Peak Flow --      Pain Score 10/31/18 0021 0     Pain Loc --      Pain Edu? --      Excl. in [place]? --     Constitutional: Alert and oriented. Well appearing and in no acute distress. Eyes: Conjunctivae are normal. Mouth/Throat: Mucous membranes are moist.  Oropharynx non-erythematous. Neck: No stridor.   Cardiovascular: Normal rate, regular rhythm. Good peripheral circulation. Grossly normal heart sounds. Respiratory: Normal respiratory effort.  No retractions. Lungs CTAB. Gastrointestinal: Soft and nontender. No distention.  Musculoskeletal: No lower extremity tenderness nor edema. No gross deformities of extremities. Neurologic:  Normal speech and language. No gross focal neurologic deficits are appreciated.  Skin: Hives noted bilateral upper extremity.      Procedures   ____________________________________________   INITIAL IMPRESSION / ASSESSMENT AND PLAN / ED COURSE  As part of my medical decision making, I reviewed the following data within the electronic MEDICAL RECORD NUMBER 42 year old female presenting with above-stated history and physical exam secondary to allergic reaction.  Patient given Benadryl 50 mg prednisone 60 mg.  Patient will be prescribed same for home.  Patient referred to allergist Dr.Juengel ____________________________________________  FINAL CLINICAL IMPRESSION(S) / ED DIAGNOSES  Final diagnoses:  Allergic reaction, initial encounter     MEDICATIONS GIVEN DURING THIS VISIT:  Medications  diphenhydrAMINE (BENADRYL)  capsule 50 mg (has no administration in time range)  predniSONE (DELTASONE) tablet 60 mg (has no administration in time range)  diphenhydrAMINE (BENADRYL) capsule 50 mg (50 mg Oral Given 10/31/18 0038)     ED Discharge Orders    None       Note:  This document was prepared using Dragon voice recognition software and may include unintentional dictation errors.    Gregor Hams, MD 10/31/18 [phone]

## 2018-10-31 NOTE — ED Notes (Signed)
Rash noted to bilateral upper extremities on assessment

## 2019-05-09 ENCOUNTER — Other Ambulatory Visit: Payer: Self-pay

## 2019-05-09 ENCOUNTER — Encounter: Payer: Self-pay | Admitting: Emergency Medicine

## 2019-05-09 DIAGNOSIS — Z5321 Procedure and treatment not carried out due to patient leaving prior to being seen by health care provider: Secondary | ICD-10-CM | POA: Insufficient documentation

## 2019-05-09 DIAGNOSIS — H5712 Ocular pain, left eye: Secondary | ICD-10-CM | POA: Diagnosis present
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
## 2019-05-10 ENCOUNTER — Emergency Department: Payer: BC Managed Care – PPO

## 2019-05-10 ENCOUNTER — Other Ambulatory Visit: Payer: Self-pay

## 2019-05-10 ENCOUNTER — Encounter: Payer: Self-pay | Admitting: Emergency Medicine

## 2019-05-10 ENCOUNTER — Emergency Department
Admission: EM | Admit: 2019-05-10 | Discharge: 2019-05-10 | Disposition: A | Discharge status: Home / Self Care | Payer: BC Managed Care – PPO | Source: Home / Self Care | Attending: Emergency Medicine | Admitting: Emergency Medicine

## 2019-05-10 ENCOUNTER — Emergency Department
Admission: EM | Admit: 2019-05-10 | Discharge: 2019-05-10 | Disposition: A | Discharge status: Home / Self Care | Payer: BC Managed Care – PPO | Attending: Emergency Medicine | Admitting: Emergency Medicine

## 2019-05-10 DIAGNOSIS — Y9389 Activity, other specified: Secondary | ICD-10-CM | POA: Insufficient documentation

## 2019-05-10 DIAGNOSIS — H209 Unspecified iridocyclitis: Secondary | ICD-10-CM

## 2019-05-10 DIAGNOSIS — H20042 Secondary noninfectious iridocyclitis, left eye: Secondary | ICD-10-CM | POA: Insufficient documentation

## 2019-05-10 DIAGNOSIS — Y999 Unspecified external cause status: Secondary | ICD-10-CM | POA: Insufficient documentation

## 2019-05-10 DIAGNOSIS — Y929 Unspecified place or not applicable: Secondary | ICD-10-CM | POA: Insufficient documentation

## 2019-05-10 MED ORDER — FLUORESCEIN SODIUM 1 MG OP STRP
1.0000 | ORAL_STRIP | Freq: Once | OPHTHALMIC | Status: AC
  Administered 2019-05-10: 1 via OPHTHALMIC
  Filled 2019-05-10: qty 1

## 2019-05-10 MED ORDER — CYCLOPENTOLATE HCL 1 % OP SOLN
2.0000 [drp] | Freq: Once | OPHTHALMIC | Status: AC
  Administered 2019-05-10: 2 [drp] via OPHTHALMIC
  Filled 2019-05-10: qty 2

## 2019-05-10 MED ORDER — PREDNISOLONE ACETATE 1 % OP SUSP: 1.0000 [drp] | Freq: Four times a day (QID) | OPHTHALMIC | 0 refills | Status: DC

## 2019-05-10 MED ORDER — TETRACAINE HCL 0.5 % OP SOLN
2.0000 [drp] | Freq: Once | OPHTHALMIC | Status: AC
  Administered 2019-05-10: 2 [drp] via OPHTHALMIC
  Filled 2019-05-10: qty 4
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
## 2019-05-10 NOTE — Discharge Instructions (Signed)
Please call in the morning to see one of the ophthalmologists.   If your pain increases or if your vision gets worse, please call the ophthalmologist's office right away. If unable to get an appointment, return to the ER.
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
## 2019-07-23 ENCOUNTER — Other Ambulatory Visit: Payer: Self-pay

## 2019-07-23 ENCOUNTER — Encounter: Payer: Self-pay | Admitting: Emergency Medicine

## 2019-07-23 ENCOUNTER — Emergency Department
Admission: EM | Admit: 2019-07-23 | Discharge: 2019-07-23 | Disposition: A | Discharge status: Home / Self Care | Payer: BC Managed Care – PPO | Attending: Emergency Medicine | Admitting: Emergency Medicine

## 2019-07-23 ENCOUNTER — Emergency Department: Payer: BC Managed Care – PPO

## 2019-07-23 DIAGNOSIS — Y929 Unspecified place or not applicable: Secondary | ICD-10-CM | POA: Diagnosis not present

## 2019-07-23 DIAGNOSIS — Y939 Activity, unspecified: Secondary | ICD-10-CM | POA: Diagnosis not present

## 2019-07-23 DIAGNOSIS — Y999 Unspecified external cause status: Secondary | ICD-10-CM | POA: Diagnosis not present

## 2019-07-23 DIAGNOSIS — R51 Headache: Secondary | ICD-10-CM | POA: Diagnosis present

## 2019-07-23 MED ORDER — ACETAMINOPHEN 500 MG PO TABS
1000.0000 mg | ORAL_TABLET | Freq: Once | ORAL | Status: AC
  Administered 2019-07-23: 1000 mg via ORAL
  Filled 2019-07-23: qty 2

## 2019-07-23 MED ORDER — MELOXICAM 15 MG PO TABS: 15.0000 mg | ORAL_TABLET | Freq: Every day | ORAL | 1 refills | Status: AC

## 2019-07-23 MED ORDER — OXYCODONE-ACETAMINOPHEN 5-325 MG PO TABS
1.0000 | ORAL_TABLET | Freq: Once | ORAL | Status: DC
  Filled 2019-07-23: qty 1

## 2019-07-23 MED ORDER — ONDANSETRON 4 MG PO TBDP
4.0000 mg | ORAL_TABLET | Freq: Once | ORAL | Status: AC
  Administered 2019-07-23: 4 mg via ORAL
  Filled 2019-07-23: qty 1

## 2019-07-23 MED ORDER — METHOCARBAMOL 500 MG PO TABS: 500.0000 mg | ORAL_TABLET | Freq: Three times a day (TID) | ORAL | 0 refills | Status: AC | PRN
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
## 2019-07-23 NOTE — Discharge Instructions (Signed)
You are going to be very sore over the next couple of days. You can take Robaxin which is a muscle relaxer tightness in the neck and low back. You have also been prescribed meloxicam which is a daily anti-inflammatory. Return to the emergency department with chest pain, chest tightness or abdominal pain.
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
## 2019-09-16 ENCOUNTER — Other Ambulatory Visit: Payer: Self-pay

## 2019-09-16 ENCOUNTER — Encounter: Payer: Self-pay | Admitting: Family Medicine

## 2019-09-16 ENCOUNTER — Ambulatory Visit: Payer: Self-pay | Admitting: Family Medicine

## 2019-09-16 DIAGNOSIS — Z113 Encounter for screening for infections with a predominantly sexual mode of transmission: Secondary | ICD-10-CM
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
## 2019-09-21 LAB — GONOCOCCUS CULTURE

## 2020-05-29 ENCOUNTER — Encounter: Payer: Self-pay | Admitting: Emergency Medicine

## 2020-05-29 ENCOUNTER — Emergency Department
Admission: EM | Admit: 2020-05-29 | Discharge: 2020-05-29 | Disposition: A | Discharge status: Home / Self Care | Payer: BC Managed Care – PPO | Attending: Emergency Medicine | Admitting: Emergency Medicine

## 2020-05-29 ENCOUNTER — Other Ambulatory Visit: Payer: Self-pay

## 2020-05-29 ENCOUNTER — Emergency Department: Payer: BC Managed Care – PPO

## 2020-05-29 DIAGNOSIS — M25712 Osteophyte, left shoulder: Secondary | ICD-10-CM | POA: Diagnosis not present

## 2020-05-29 DIAGNOSIS — M25512 Pain in left shoulder: Secondary | ICD-10-CM

## 2020-05-29 DIAGNOSIS — M7582 Other shoulder lesions, left shoulder: Secondary | ICD-10-CM

## 2020-05-29 MED ORDER — ETODOLAC 400 MG PO TABS: 400.0000 mg | ORAL_TABLET | Freq: Two times a day (BID) | ORAL | 0 refills | Status: DC

## 2020-05-29 MED ORDER — TRAMADOL HCL 50 MG PO TABS: 50.0000 mg | ORAL_TABLET | Freq: Four times a day (QID) | ORAL | 0 refills | Status: DC | PRN

## 2020-05-29 MED ORDER — KETOROLAC TROMETHAMINE 30 MG/ML IJ SOLN
30.0000 mg | Freq: Once | INTRAMUSCULAR | Status: AC
  Administered 2020-05-29: 30 mg via INTRAMUSCULAR
  Filled 2020-05-29: qty 1

## 2020-05-29 NOTE — ED Notes (Signed)
Pt verbalized understanding of discharge instructions. NAD at this time. 

## 2020-05-29 NOTE — ED Triage Notes (Signed)
Pt arrived via POV with reports left shoulder pain and left side neck pain. Pt states yesterday after eating breakfast, pt states she has been unable to move left shoulder, pt states when she tries to move it she is in a lot of pain.   Taking OTC meds without relief.

## 2020-05-29 NOTE — ED Notes (Signed)
Pt with c/o of left shoulder pain that started yesterday. Pt unable to raise left arm without assisting it with right hand. Pt denies injury and no deformity noted.

## 2020-05-29 NOTE — Discharge Instructions (Addendum)
Follow-up with Dr. Earnestine Leys if any continued problems with your shoulder.  Begin taking the etodolac 400 mg twice daily with food.  You may take Tylenol with this medication if additional pain medication as needed.  Tramadol is strictly for pain.  This medication could cause drowsiness and increase your risk for injury.  You may use ice or heat to your shoulder as needed for discomfort.  Wear the sling no more than 5 days to prevent your shoulder being frozen.  Also daily try to keep range of motion of your shoulder but do not force it until you are having pain.

## 2020-05-29 NOTE — ED Provider Notes (Signed)
[facility] Emergency Department Provider Note  ____________________________________________   First MD Initiated Contact with Patient 05/29/20 [phone]     (approximate)  I have reviewed the triage vital signs and the nursing notes.   HISTORY  Chief Complaint Shoulder Pain    HPI Nicole Mcgee is a 44 y.o. female reports that she began having problems with her left shoulder yesterday.  Patient states that there has been no history of injury.  She states that she is unable to lift her shoulder without pain.  Patient has taken ibuprofen and Tylenol without any relief.  She denies any paresthesias, shortness of breath, chest pain, nausea, vomiting.  Pain is increased with movement.  She rates her pain as a 9 out of 10.       History reviewed. No pertinent past medical history.  There are no problems to display for this patient.   Past Surgical History:  Procedure Laterality Date  . CESAREAN SECTION      Prior to Admission medications   Medication Sig Start Date End Date Taking? Authorizing Provider  etodolac (LODINE) 400 MG tablet Take 1 tablet (400 mg total) by mouth 2 (two) times daily. 05/29/20   Johnn Hai, PA-C  prednisoLONE acetate (PRED FORTE) 1 % ophthalmic suspension Place 1 drop into the left eye 4 (four) times daily. 05/10/19   Triplett, Johnette Abraham B, FNP  traMADol (ULTRAM) 50 MG tablet Take 1 tablet (50 mg total) by mouth every 6 (six) hours as needed. 05/29/20   Johnn Hai, PA-C    Allergies Patient has no known allergies.  History reviewed. No pertinent family history.  Social History Social History   Tobacco Use  . Smoking status: Never Smoker  . Smokeless tobacco: Never Used  Substance Use Topics  . Alcohol use: No  . Drug use: No    Review of Systems Constitutional: No fever/chills Eyes: No visual changes. ENT: No sore throat. Cardiovascular: Denies chest pain. Respiratory: Denies shortness of  breath. Gastrointestinal: No abdominal pain.  No nausea, no vomiting.  Musculoskeletal: Positive for left shoulder pain. Skin: Negative for rash. Neurological: Negative for headaches, focal weakness or numbness.  ____________________________________________   PHYSICAL EXAM:  VITAL SIGNS: ED Triage Vitals  Enc Vitals Group     BP 05/29/20 0643 (!) 157/99     Pulse Rate 05/29/20 0643 76     Resp 05/29/20 0643 17     Temp 05/29/20 0643 98.8 F (37.1 C)     Temp Source 05/29/20 0643 Oral     SpO2 05/29/20 0643 100 %     Weight 05/29/20 0645 226 lb (102.5 kg)     Height 05/29/20 0645 5\' 2"  (1.575 m)     Head Circumference --      Peak Flow --      Pain Score 05/29/20 0654 9     Pain Loc --      Pain Edu? --      Excl. in [place]? --     Constitutional: Alert and oriented. Well appearing and in no acute distress. Eyes: Conjunctivae are normal.  Head: Atraumatic. Nose: No congestion/rhinnorhea. Neck: No stridor.   Cardiovascular: Normal rate, regular rhythm. Grossly normal heart sounds.  Good peripheral circulation. Respiratory: Normal respiratory effort.  No retractions. Lungs CTAB. Gastrointestinal: Soft and nontender. No distention. No abdominal bruits. No CVA tenderness. Musculoskeletal: On examination of left shoulder there is no gross deformity however there is moderate tenderness on palpation of the [facility]  joint area.  Range of motion is restricted in all planes secondary to increased pain.  Skin is intact.  No erythema or discoloration noted. Neurologic:  Normal speech and language. No gross focal neurologic deficits are appreciated. No gait instability. Skin:  Skin is warm, dry and intact. No rash noted. Psychiatric: Mood and affect are normal. Speech and behavior are normal.  ____________________________________________   LABS (all labs ordered are listed, but only abnormal results are displayed)  Labs Reviewed - No data to  display ____________________________________________   RADIOLOGY   Official radiology report(s): DG Shoulder Left  Result Date: 05/29/2020 CLINICAL DATA:  Pain and difficulty with motion EXAM: LEFT SHOULDER - 2+ VIEW COMPARISON:  None. FINDINGS: Frontal, oblique, and Y scapular images were obtained. No fracture or dislocation. There is slight spurring along the distal clavicle. There is no appreciable joint space narrowing or erosion. No abnormal calcification. Visualized left lung clear. IMPRESSION: Small spur along the superolateral clavicle. No appreciable joint space narrowing or erosion. No fracture or dislocation. Electronically Signed   By: Lowella Grip III M.D.   On: 05/29/2020 08:22    ____________________________________________   PROCEDURES  Procedure(s) performed (including Critical Care):  Procedures  Sling was applied by RN ____________________________________________   INITIAL IMPRESSION / ASSESSMENT AND PLAN / ED COURSE  As part of my medical decision making, I reviewed the following data within the electronic MEDICAL RECORD NUMBER Notes from prior ED visits and [place] Controlled Substance Database  44 year old female presents to the ED with complaint of left shoulder pain that started yesterday.  She states that she is unable to move her shoulder without pain.  She denies any injury to her shoulder.  She has taken ibuprofen and Tylenol without any relief.  Physical exam shows tenderness in the [facility] joint area with x-ray showing a spur on the distal clavicle.  Patient was given Toradol 30 mg IM and placed in a sling with instructions not to wear it more than 5 days.  A prescription for etodolac 400 mg and tramadol every 6 hours if needed was sent to her pharmacy.  She is to follow-up with Dr. Earnestine Leys who is on-call for orthopedics if she continues to have problems with her shoulder.  ____________________________________________   FINAL CLINICAL IMPRESSION(S) / ED  DIAGNOSES  Final diagnoses:  Acute pain of left shoulder  Bone spur of acromioclavicular joint, left     ED Discharge Orders         Ordered    etodolac (LODINE) 400 MG tablet  2 times daily,   Status:  Discontinued     Reprint     05/29/20 0957    traMADol (ULTRAM) 50 MG tablet  Every 6 hours PRN     Discontinue  Reprint     05/29/20 0957    etodolac (LODINE) 400 MG tablet  2 times daily     Discontinue  Reprint     05/29/20 0958           Note:  This document was prepared using Dragon voice recognition software and may include unintentional dictation errors.    Johnn Hai, PA-C 05/29/20 1003    Nena Polio, MD 05/29/20 1439

## 2020-07-03 ENCOUNTER — Encounter: Payer: Self-pay | Admitting: Emergency Medicine

## 2020-07-03 ENCOUNTER — Other Ambulatory Visit: Payer: Self-pay

## 2020-07-03 DIAGNOSIS — N898 Other specified noninflammatory disorders of vagina: Secondary | ICD-10-CM | POA: Insufficient documentation

## 2020-07-03 DIAGNOSIS — Z5321 Procedure and treatment not carried out due to patient leaving prior to being seen by health care provider: Secondary | ICD-10-CM | POA: Diagnosis not present

## 2020-07-03 NOTE — ED Triage Notes (Signed)
Patient states that she has heavy vaginal discharge times one month. Patient states that it has an odor.

## 2020-07-04 ENCOUNTER — Emergency Department
Admission: EM | Admit: 2020-07-04 | Discharge: 2020-07-04 | Disposition: A | Discharge status: Home / Self Care | Payer: BC Managed Care – PPO | Attending: Emergency Medicine | Admitting: Emergency Medicine

## 2020-07-05 ENCOUNTER — Ambulatory Visit: Payer: Self-pay | Admitting: Family Medicine

## 2020-07-05 ENCOUNTER — Encounter: Payer: Self-pay | Admitting: Family Medicine

## 2020-07-05 ENCOUNTER — Other Ambulatory Visit: Payer: Self-pay

## 2020-07-05 DIAGNOSIS — Z113 Encounter for screening for infections with a predominantly sexual mode of transmission: Secondary | ICD-10-CM

## 2020-07-05 DIAGNOSIS — A599 Trichomoniasis, unspecified: Secondary | ICD-10-CM

## 2020-07-05 LAB — WET PREP FOR TRICH, YEAST, CLUE
Trichomonas Exam: POSITIVE — AB
Yeast Exam: NEGATIVE

## 2020-07-05 MED ORDER — METRONIDAZOLE 500 MG PO TABS: 2000.0000 mg | ORAL_TABLET | Freq: Once | ORAL | 0 refills | Status: AC

## 2020-07-05 MED ORDER — FLUCONAZOLE 150 MG PO TABS: 150.0000 mg | ORAL_TABLET | Freq: Once | ORAL | 0 refills | Status: AC

## 2020-07-05 NOTE — Progress Notes (Signed)
[facility] Department STI clinic/screening visit  Subjective:  Nicole Mcgee is a 44 y.o. female being seen today for an STI screening visit. The patient reports they do have symptoms.  Patient reports that they do not desire a pregnancy in the next year.   They reported they are not interested in discussing contraception today.  Patient's last menstrual period was 07/04/2020.   Patient has the following medical conditions:  There are no problems to display for this patient.   No chief complaint on file.   HPI  Patient reports tghat she had noted her underwear is wet with a discharge with a foul odor daily.   She has noted brown discharge stain in her underwear.  States he had this discharge since her last visit on 09/2019.  Denies other symptoms or new partner.  Last HIV test per patient/review of record was unknown Patient reports last pap was unknown   See flowsheet for further details and programmatic requirements.    The following portions of the patient's history were reviewed and updated as appropriate: allergies, current medications, past medical history, past social history, past surgical history and problem list.  Objective:  There were no vitals filed for this visit.  Physical Exam Vitals and nursing note reviewed.  Constitutional:      Appearance: She is obese.  HENT:     Head: Normocephalic and atraumatic.     Mouth/Throat:     Mouth: Mucous membranes are moist.     Pharynx: Oropharynx is clear. No oropharyngeal exudate or posterior oropharyngeal erythema.  Pulmonary:     Effort: Pulmonary effort is normal.  Abdominal:     General: Abdomen is flat.     Palpations: There is no mass.     Tenderness: There is no abdominal tenderness. There is no rebound.  Genitourinary:    General: Normal vulva.     Exam position: Lithotomy position.     Pubic Area: No rash or pubic lice.      Labia:        Right: No rash or lesion.        Left: No rash or  lesion.      Vagina: Normal. No vaginal discharge, erythema, bleeding or lesions.     Cervix: No discharge, friability, lesion or erythema.     Rectum: Normal.     Comments: Bloody discharge noted.  PH not done d/t blood Bimanual  Not indicated Lymphadenopathy:     Head:     Right side of head: No preauricular or posterior auricular adenopathy.     Left side of head: No preauricular or posterior auricular adenopathy.     Cervical: No cervical adenopathy.     Upper Body:     Right upper body: No supraclavicular or axillary adenopathy.     Left upper body: No supraclavicular or axillary adenopathy.     Lower Body: No right inguinal adenopathy. No left inguinal adenopathy.  Skin:    General: Skin is warm and dry.     Findings: No rash.  Neurological:     Mental Status: She is alert and oriented to person, place, and time.    Assessment and Plan:  Nicole Mcgee is a 44 y.o. female presenting to the [facility] Department for STI screening  1. Screening examination for venereal disease  - WET PREP FOR [place], YEAST, CLUE - Chlamydia/Gonorrhea [facility]  2. Trichimoniasis Metronidazole 2 gm po once. Co no sexual activity x  1 week. Co. Partner needs treatment.  Condoms always. Client requesting treatment for yeast after she takes treatment for Trich Diflucan 150 mg 1 po once.  No follow-ups on file.  No future appointments.  Hassell Done, FNP

## 2020-07-05 NOTE — Progress Notes (Signed)
Wet mount reviewed with provider and pt treated for Trich per Hassell Done, FNP order and per standing order. Counseled pt per provider orders and pt states understanding. Pt reports that she gets yeast infections often after taking antibiotics and states that yeast creams do not help. Consulted with Hassell Done, FNP who states that she will dispense and give pt Diflucan. Provider orders completed.

## 2020-07-28 ENCOUNTER — Telehealth: Payer: Self-pay | Admitting: Family Medicine

## 2020-07-28 NOTE — Telephone Encounter (Signed)
Returned patient phone call to provided contact number. Patient states she was here around 3 weeks ago and was treated for Trich and symptoms had improved until 2 days ago but now odor has returned "just not as bad." Patient agreeable to come back in for an STI appt recheck. Patient scheduled for 07/29/2020 @ 3:00. Instructed to arrive at 2:45 for check in. Hal Morales, RN

## 2020-07-28 NOTE — Telephone Encounter (Signed)
Pt states she received medications for sti tx about two weeks ago. Still having symptoms.

## 2020-07-29 ENCOUNTER — Other Ambulatory Visit: Payer: Self-pay

## 2020-07-29 ENCOUNTER — Ambulatory Visit: Payer: Self-pay | Admitting: Physician Assistant

## 2020-07-29 DIAGNOSIS — N760 Acute vaginitis: Secondary | ICD-10-CM

## 2020-07-29 DIAGNOSIS — Z299 Encounter for prophylactic measures, unspecified: Secondary | ICD-10-CM

## 2020-07-29 DIAGNOSIS — B9689 Other specified bacterial agents as the cause of diseases classified elsewhere: Secondary | ICD-10-CM

## 2020-07-29 DIAGNOSIS — Z113 Encounter for screening for infections with a predominantly sexual mode of transmission: Secondary | ICD-10-CM

## 2020-07-29 LAB — WET PREP FOR TRICH, YEAST, CLUE
Trichomonas Exam: NEGATIVE
Yeast Exam: NEGATIVE

## 2020-07-29 MED ORDER — METRONIDAZOLE 500 MG PO TABS: 500.0000 mg | ORAL_TABLET | Freq: Two times a day (BID) | ORAL | 0 refills | Status: AC

## 2020-07-29 MED ORDER — FLUCONAZOLE 150 MG PO TABS: 150.0000 mg | ORAL_TABLET | Freq: Once | ORAL | 0 refills | Status: AC

## 2020-07-29 NOTE — Progress Notes (Signed)
Here today for STD screening. Last appt here was 07/05/2020 and was treated for Trich. States she took medicine we had given her that day. Waited until 07/24/2020 to have sex with same partner. Is unsure if he was treated for Trich. Declines bloodwork. Hal Morales, RN

## 2020-07-30 NOTE — Progress Notes (Signed)
S:  Patient into clinic today requesting recheck to make sure that Dalbert Batman has cleared.  States that she was seen on 07/05/2020, about 3 weeks ago and treated for Trich.  Reports that both she and her partner were treated and the symptoms had resolved.  States that they had sex on 07/24/2020, and her symptoms have started again but are not as bad.  States that she has had in increase in her vaginal discharge and a slight discharge since last Wednesday.  Reviewed information/history given on 07/05/2020 and patient denies any changes except as noted above.  Declines repeat screening for GC/Chlamydia and blood work today. O:  WDWN female in NAD, A&O x 3, pleasant, cooperative, normal work of breathing; patient opted to self collect sample for wet mount.  Wet mount= positive clue and amine, negative for trich and yeast. A/P:  1.  Patient needs treatment for BV. 2.  Counseled patient re:  BV and how to prevent BV and yeast. 3.  Reassured patient that Dalbert Batman was not seen today and that other results from recent testing were negative. 4.  Metronidazole 500 mg #14 1 po BID for 7 days with food, no EtOH for 24 hr before and until 72 hr after completing medicine.  The patient was dispensed Metronidazole and Fluconazole  today. I provided counseling today regarding the medication. We discussed the medication, the side effects and when to call clinic. Patient given the opportunity to ask questions. Questions answered.  5.  Patient also give Fluconazole 150 mg #1 to take at the onset of itching due to patient request that she will get yeast when she takes an antibiotic. 6.  No sex for 10 days. 7.  Condoms with all sex. 8.  RTC prn.

## 2020-07-30 NOTE — Progress Notes (Signed)
Chart reviewed by Pharmacist  Suzanne Walker PharmD, Contract Pharmacist at [facility]  

## 2020-10-24 ENCOUNTER — Ambulatory Visit: Payer: BC Managed Care – PPO

## 2020-11-19 DIAGNOSIS — U071 COVID-19: Secondary | ICD-10-CM

## 2020-11-19 HISTORY — DX: COVID-19: U07.1

## 2021-04-28 ENCOUNTER — Other Ambulatory Visit: Payer: Self-pay

## 2021-04-28 ENCOUNTER — Ambulatory Visit: Payer: Self-pay | Admitting: Advanced Practice Midwife

## 2021-04-28 ENCOUNTER — Encounter: Payer: Self-pay | Admitting: Advanced Practice Midwife

## 2021-04-28 DIAGNOSIS — Z113 Encounter for screening for infections with a predominantly sexual mode of transmission: Secondary | ICD-10-CM

## 2021-04-28 DIAGNOSIS — T7411XA Adult physical abuse, confirmed, initial encounter: Secondary | ICD-10-CM | POA: Insufficient documentation

## 2021-04-28 DIAGNOSIS — N760 Acute vaginitis: Secondary | ICD-10-CM

## 2021-04-28 DIAGNOSIS — T7411XS Adult physical abuse, confirmed, sequela: Secondary | ICD-10-CM

## 2021-04-28 DIAGNOSIS — B9689 Other specified bacterial agents as the cause of diseases classified elsewhere: Secondary | ICD-10-CM

## 2021-04-28 LAB — WET PREP FOR TRICH, YEAST, CLUE
Trichomonas Exam: NEGATIVE
Yeast Exam: NEGATIVE

## 2021-04-28 MED ORDER — METRONIDAZOLE 500 MG PO TABS: 500.0000 mg | ORAL_TABLET | Freq: Two times a day (BID) | ORAL | 0 refills | Status: AC

## 2021-04-28 NOTE — Progress Notes (Signed)
Concern about brown discharge

## 2021-04-28 NOTE — Progress Notes (Signed)
[facility] Department STI clinic/screening visit  Subjective:  Nicole Mcgee is a 45 y.o. SBF G2P2 nonsmoker female being seen today for an STI screening visit. The patient reports they do have symptoms.  Patient reports that they do not desire a pregnancy in the next year.   They reported they are not interested in discussing contraception today.  No LMP recorded.   Patient has the following medical conditions:   Patient Active Problem List   Diagnosis Date Noted   Morbid obesity ([place]) 226 lbs 04/28/2021    Chief Complaint  Patient presents with   SEXUALLY TRANSMITTED DISEASE    STI Screen    HPI  Patient reports "I don't feel myself down there" late menses and then brown blood x4 days when wipes.  LMP 04/14/21. Last sex 03/31/21 without condom; with current partner since 2013; 1 partner in last 3 mo. Last MJ in 20's. Last ETOH 04/15/21 (2 glasses wine) 1x/mo. Last cigar age 27.   Last HIV test per patient/review of record was unknown Patient reports last pap was 2021 neg  See flowsheet for further details and programmatic requirements.    The following portions of the patient's history were reviewed and updated as appropriate: allergies, current medications, past medical history, past social history, past surgical history and problem list.  Objective:  There were no vitals filed for this visit.  Physical Exam Vitals and nursing note reviewed.  Constitutional:      Appearance: Normal appearance. She is obese.  HENT:     Head: Normocephalic and atraumatic.     Mouth/Throat:     Mouth: Mucous membranes are moist.     Pharynx: Oropharynx is clear. No oropharyngeal exudate or posterior oropharyngeal erythema.  Eyes:     Conjunctiva/sclera: Conjunctivae normal.  Pulmonary:     Effort: Pulmonary effort is normal.  Chest:  Breasts:    Right: No axillary adenopathy or supraclavicular adenopathy.     Left: No axillary adenopathy or supraclavicular adenopathy.   Abdominal:     Palpations: Abdomen is soft. There is no mass.     Tenderness: There is no abdominal tenderness. There is no rebound.     Comments: Soft without masses or tenderness, poor tone  Genitourinary:    General: Normal vulva.     Exam position: Lithotomy position.     Pubic Area: No rash or pubic lice.      Labia:        Right: No rash or lesion.        Left: No rash or lesion.      Vagina: Normal. No vaginal discharge, erythema, bleeding (small amt red watery blood, ph>4.5) or lesions.     Cervix: Normal.     Uterus: Normal.      Adnexa: Right adnexa normal and left adnexa normal.     Rectum: Normal.  Lymphadenopathy:     Head:     Right side of head: No preauricular or posterior auricular adenopathy.     Left side of head: No preauricular or posterior auricular adenopathy.     Cervical: No cervical adenopathy.     Upper Body:     Right upper body: No supraclavicular or axillary adenopathy.     Left upper body: No supraclavicular or axillary adenopathy.     Lower Body: No right inguinal adenopathy. No left inguinal adenopathy.  Skin:    General: Skin is warm and dry.     Findings: No rash.  Neurological:  Mental Status: She is alert and oriented to person, place, and time.     Assessment and Plan:  Nicole Mcgee is a 45 y.o. female presenting to the [facility] Department for STI screening  1. Morbid obesity ([facility]) 226 lbs   2. Screening examination for venereal disease Treat wet mount per standing orders Immunization nurse consult - Gonococcus culture - Chlamydia/Gonorrhea [facility] - Gonococcus culture - WET PREP FOR [place], YEAST, CLUE     No follow-ups on file.  No future appointments.  Herbie Saxon, CNM

## 2021-04-28 NOTE — Progress Notes (Signed)
Pt here for STD screening.  Wet mount results reviewed.  Medication dispensed per SO.  Pt requested medication to prevent a yeast infection.  Pt informed to get OTC Clotrimazole vaginal cream, eat a yogurt a day or take a probiotic each day, per Donnal Moat CNM. Windle Guard, RN

## 2021-05-03 LAB — GONOCOCCUS CULTURE

## 2021-05-10 ENCOUNTER — Telehealth: Payer: Self-pay | Admitting: Family Medicine

## 2021-05-10 NOTE — Telephone Encounter (Addendum)
Phone call forwarded to [facility]. RN spoke with patient which was asking for STD results RN verified chart password. All STD results verbally given to patient. Patient also complaining of intermittent vaginal spotting stating she wasn't sure if that was Menopause symptoms. Patient states she has had a BTL for birth control. RN advised to follow up with PCP for above concerns. Patient verbalized understanding of above. Hal Morales, RN

## 2021-05-17 ENCOUNTER — Other Ambulatory Visit: Payer: Self-pay

## 2021-05-17 ENCOUNTER — Ambulatory Visit
Admission: RE | Admit: 2021-05-17 | Discharge: 2021-05-17 | Disposition: A | Discharge status: Home / Self Care | Payer: BC Managed Care – PPO | Source: Ambulatory Visit | Attending: Obstetrics | Admitting: Obstetrics

## 2021-05-17 ENCOUNTER — Other Ambulatory Visit: Payer: Self-pay | Admitting: Obstetrics

## 2021-05-17 DIAGNOSIS — N921 Excessive and frequent menstruation with irregular cycle: Secondary | ICD-10-CM | POA: Insufficient documentation

## 2021-06-14 ENCOUNTER — Other Ambulatory Visit: Payer: Self-pay | Admitting: Obstetrics and Gynecology

## 2021-07-14 NOTE — H&P (Signed)
Nicole Mcgee is a 45 y.o. female here for [facility] , bilateral salpingectomy .pt here for a long history of menorrhagia . Bleeds 7 days ++ clots + dysmenorrhea , rare dyspareunia . Prior D+C 2004 for the same  LOw ferritin =8 G2P2 s/p LTCS x 2 + btl  recently started OCP U/s 05/17/21: 11 weeks o/w unremarkable  Embx + pap neg 7/22   Past Medical History:  has no past medical history on file.  Past Surgical History:  has a past surgical history that includes Left ankle repair (Left); Cesarean section (04/19/1992); Hysteroscopy (2004); and Tubal ligation. Family History: family history is not on file. Social History:  reports that she has never smoked. She has never used smokeless tobacco. She reports previous alcohol use. She reports that she does not use drugs. OB/GYN History:          OB History     Gravida  2   Para  2   Term  2   Preterm      AB      Living  2      SAB      IAB      Ectopic      Molar      Multiple      Live Births  2             Allergies: has No Known Allergies. Medications:   Current Outpatient Medications:    norgestimate-ethinyl estradioL (SPRINTEC 0.25/35, 28,) 0.25-35 mg-mcg tablet, Take 4 tabs on day 1. Then 3 tabs on day 2. Then 2 tabs on day 3. Then 1 tab daily until pack is finished., Disp: 28 tablet, Rfl: 11   Review of Systems: General:                      No fatigue or weight loss Eyes:                           No vision changes Ears:                            No hearing difficulty Respiratory:                No cough or shortness of breath Pulmonary:                  No asthma or shortness of breath Cardiovascular:           No chest pain, palpitations, dyspnea on exertion Gastrointestinal:          No abdominal bloating, chronic diarrhea, constipations, masses, pain or hematochezia Genitourinary:             No hematuria, dysuria, abnormal vaginal discharge, pelvic pain, +Menometrorrhagia Lymphatic:                   No  swollen lymph nodes Musculoskeletal:         No muscle weakness Neurologic:                  No extremity weakness, syncope, seizure disorder Psychiatric:                  No history of depression, delusions or suicidal/homicidal ideation      Exam:       Vitals:    07/14/21 1356  BP: 130/83  Pulse: 80  Body mass index is 42.25 kg/m.   WDWN / black female in NAD   Lungs: CTA  CV : RRR without murmur   Neck:  no thyromegaly Abdomen: soft , no mass, normal active bowel sounds,  non-tender, no rebound tenderness Pelvic: tanner stage 5 ,  External genitalia: vulva /labia no lesions Urethra: no prolapse Vagina: normal physiologic d/climited room to perform a LAVH  Cervix:high - large speculum  no lesions, no cervical motion tenderness   Uterus: 12 weeks normal size shape and contour, non-tender Adnexa: no mass,  non-tender   Rectovaginal:     Impression:    The primary encounter diagnosis was Menorrhagia with irregular cycle. Diagnoses of Cervical cancer screening and Iron deficiency anemia due to chronic blood loss were also pertinent to this visit.       Plan:     A thorough discussion of options : ocps , Lysteda , IUD , ablation or Hysterectomy discussed . Pros and cons discussed . Pt has elected for the later . Given 2 c/s and high cervix I offered a LSH  With bilateral salpingectomy . She is aware of the rare potential for upstaging an occult cancer with the morcellator risk 1:[id number]:[id number],000 She is in agreement to proceed with surgery  Risks associated with the procedure have been discussed with the pt . See [facility] notes)        Caroline Sauger, MD

## 2021-07-25 ENCOUNTER — Other Ambulatory Visit: Payer: Self-pay

## 2021-07-25 ENCOUNTER — Ambulatory Visit
Admission: RE | Admit: 2021-07-25 | Discharge: 2021-07-25 | Disposition: A | Discharge status: Home / Self Care | Payer: BC Managed Care – PPO | Source: Ambulatory Visit | Attending: Obstetrics and Gynecology | Admitting: Obstetrics and Gynecology

## 2021-07-25 NOTE — Patient Instructions (Addendum)
Your procedure is scheduled on: 08/04/21 - FRIDAY Report to the Registration Desk on the 1st floor of the [facility]. To find out your arrival time, please call [phone] between 1PM - 3PM on: 08/03/21 - Thursday REPORT TO [facility] ON 07/26/21 AT 11:00 for labs  REMEMBER: Instructions that are not followed completely may result in serious medical risk, up to and including death; or upon the discretion of your surgeon and anesthesiologist your surgery may need to be rescheduled.  Do not eat food after midnight the night before surgery.  No gum chewing, lozengers or hard candies.  You may however, drink CLEAR liquids up to 2 hours before you are scheduled to arrive for your surgery. Do not drink anything within 2 hours of your scheduled arrival time.  Clear liquids include: - water  - apple juice without pulp - gatorade (not RED, PURPLE, OR BLUE) - black coffee or tea (Do NOT add milk or creamers to the coffee or tea) Do NOT drink anything that is not on this list.  In addition, your doctor has ordered for you to drink the provided  Ensure Pre-Surgery Clear Carbohydrate Drink  Drinking this carbohydrate drink up to two hours before surgery helps to reduce insulin resistance and improve patient outcomes. Please complete drinking 2 hours prior to scheduled arrival time.  TAKE THESE MEDICATIONS THE MORNING OF SURGERY WITH A SIP OF WATER: None  One week prior to surgery: Stop Anti-inflammatories (NSAIDS) such as Advil, Aleve, Ibuprofen, Motrin, Naproxen, Naprosyn and Aspirin based products such as Excedrin, Goodys Powder, BC Powder.  Stop ANY OVER THE COUNTER supplements until after surgery.  You may however, continue to take Tylenol if needed for pain up until the day of surgery.  No Alcohol for 24 hours before or after surgery.  No Smoking including e-cigarettes for 24 hours prior to surgery.  No chewable tobacco products for at least 6 hours prior to surgery.  No nicotine  patches on the day of surgery.  Do not use any "recreational" drugs for at least a week prior to your surgery.  Please be advised that the combination of cocaine and anesthesia may have negative outcomes, up to and including death. If you test positive for cocaine, your surgery will be cancelled.  On the morning of surgery brush your teeth with toothpaste and water, you may rinse your mouth with mouthwash if you wish. Do not swallow any toothpaste or mouthwash.  Do not wear jewelry, make-up, hairpins, clips or nail polish.  Do not wear lotions, powders, or perfumes.   Do not shave body from the neck down 48 hours prior to surgery just in case you cut yourself which could leave a site for infection.  Also, freshly shaved skin may become irritated if using the CHG soap.  Contact lenses, hearing aids and dentures may not be worn into surgery.  Do not bring valuables to the hospital. [facility] is not responsible for any missing/lost belongings or valuables.   Use CHG Soap or wipes as directed on instruction sheet.  Notify your doctor if there is any change in your medical condition (cold, fever, infection).  Wear comfortable clothing (specific to your surgery type) to the hospital.  After surgery, you can help prevent lung complications by doing breathing exercises.  Take deep breaths and cough every 1-2 hours. Your doctor may order a device called an Incentive Spirometer to help you take deep breaths. When coughing or sneezing, hold a pillow firmly against your  incision with both hands. This is called "splinting." Doing this helps protect your incision. It also decreases belly discomfort.  If you are being admitted to the hospital overnight, leave your suitcase in the car. After surgery it may be brought to your room.  If you are being discharged the day of surgery, you will not be allowed to drive home. You will need a responsible adult (18 years or older) to drive you home and stay  with you that night.   If you are taking public transportation, you will need to have a responsible adult (18 years or older) with you. Please confirm with your physician that it is acceptable to use public transportation.   Please call the [facility] at [phone] if you have any questions about these instructions.  Surgery Visitation Policy:  Patients undergoing a surgery or procedure may have one family member or support person with them as long as that person is not COVID-19 positive or experiencing its symptoms.  That person may remain in the waiting area during the procedure.  Inpatient Visitation:    Visiting hours are 7 a.m. to 8 p.m. Inpatients will be allowed two visitors daily. The visitors may change each day during the patient's stay. No visitors under the age of 49. Any visitor under the age of 45 must be accompanied by an adult. The visitor must pass COVID-19 screenings, use hand sanitizer when entering and exiting the patient's room and wear a mask at all times, including in the patient's room. Patients must also wear a mask when staff or their visitor are in the room. Masking is required regardless of vaccination status.

## 2021-07-26 ENCOUNTER — Other Ambulatory Visit
Admission: RE | Admit: 2021-07-26 | Discharge: 2021-07-26 | Disposition: A | Discharge status: Home / Self Care | Payer: BC Managed Care – PPO | Source: Ambulatory Visit | Attending: Obstetrics and Gynecology | Admitting: Obstetrics and Gynecology

## 2021-07-26 ENCOUNTER — Encounter: Payer: Self-pay | Admitting: Urgent Care

## 2021-07-26 DIAGNOSIS — Z01812 Encounter for preprocedural laboratory examination: Secondary | ICD-10-CM | POA: Diagnosis present

## 2021-07-26 LAB — BASIC METABOLIC PANEL
Anion gap: 7 (ref 5–15)
BUN: 12 mg/dL (ref 6–20)
CO2: 26 mmol/L (ref 22–32)
Calcium: 9 mg/dL (ref 8.9–10.3)
Chloride: 105 mmol/L (ref 98–111)
Creatinine, Ser: 0.85 mg/dL (ref 0.44–1.00)
GFR, Estimated: >60 mL/min (ref >60)
Glucose, Bld: 91 mg/dL (ref 70–99)
Potassium: 3.9 mmol/L (ref 3.5–5.1)
Sodium: 138 mmol/L (ref 135–145)

## 2021-07-26 LAB — TYPE AND SCREEN
ABO/RH(D): O POS
Antibody Screen: NEGATIVE

## 2021-07-26 LAB — CBC
HCT: 33.4 % — ABNORMAL LOW (ref 36.0–46.0)
Hemoglobin: 10.3 g/dL — ABNORMAL LOW (ref 12.0–15.0)
MCH: 25.1 pg — ABNORMAL LOW (ref 26.0–34.0)
MCHC: 30.8 g/dL (ref 30.0–36.0)
MCV: 81.3 fL (ref 80.0–100.0)
Platelets: 416 10*3/uL — ABNORMAL HIGH (ref 150–400)
RBC: 4.11 MIL/uL (ref 3.87–5.11)
RDW: 14.2 % (ref 11.5–15.5)
WBC: 7 10*3/uL (ref 4.0–10.5)
nRBC: 0 % (ref 0.0–0.2)

## 2021-08-04 ENCOUNTER — Encounter: Payer: Self-pay | Admitting: Obstetrics and Gynecology

## 2021-08-04 ENCOUNTER — Ambulatory Visit
Admission: RE | Admit: 2021-08-04 | Discharge: 2021-08-04 | Disposition: A | Discharge status: Home / Self Care | Payer: BC Managed Care – PPO | Attending: Obstetrics and Gynecology | Admitting: Obstetrics and Gynecology

## 2021-08-04 ENCOUNTER — Encounter
Admission: RE | Disposition: A | Discharge status: Home / Self Care | Payer: Self-pay | Source: Home / Self Care | Attending: Obstetrics and Gynecology

## 2021-08-04 ENCOUNTER — Other Ambulatory Visit: Payer: Self-pay

## 2021-08-04 ENCOUNTER — Ambulatory Visit: Payer: BC Managed Care – PPO | Admitting: Certified Registered"

## 2021-08-04 DIAGNOSIS — N736 Female pelvic peritoneal adhesions (postinfective): Secondary | ICD-10-CM | POA: Diagnosis not present

## 2021-08-04 DIAGNOSIS — D500 Iron deficiency anemia secondary to blood loss (chronic): Secondary | ICD-10-CM | POA: Diagnosis not present

## 2021-08-04 DIAGNOSIS — D259 Leiomyoma of uterus, unspecified: Secondary | ICD-10-CM | POA: Insufficient documentation

## 2021-08-04 DIAGNOSIS — N802 Endometriosis of fallopian tube: Secondary | ICD-10-CM | POA: Insufficient documentation

## 2021-08-04 DIAGNOSIS — N838 Other noninflammatory disorders of ovary, fallopian tube and broad ligament: Secondary | ICD-10-CM | POA: Diagnosis not present

## 2021-08-04 DIAGNOSIS — Z98891 History of uterine scar from previous surgery: Secondary | ICD-10-CM | POA: Diagnosis not present

## 2021-08-04 DIAGNOSIS — N920 Excessive and frequent menstruation with regular cycle: Secondary | ICD-10-CM | POA: Diagnosis present

## 2021-08-04 HISTORY — PX: LAPAROSCOPIC SUPRACERVICAL HYSTERECTOMY: SHX5399

## 2021-08-04 HISTORY — PX: LAPAROSCOPIC LYSIS OF ADHESIONS: SHX5905

## 2021-08-04 HISTORY — PX: CYSTOSCOPY: SHX5120

## 2021-08-04 HISTORY — PX: LAPAROSCOPIC BILATERAL SALPINGECTOMY: SHX5889

## 2021-08-04 LAB — POCT PREGNANCY, URINE: Preg Test, Ur: NEGATIVE

## 2021-08-04 LAB — ABO/RH: ABO/RH(D): O POS

## 2021-08-04 SURGERY — HYSTERECTOMY, SUPRACERVICAL, LAPAROSCOPIC
Anesthesia: General | Site: Pelvis

## 2021-08-04 MED ORDER — FENTANYL CITRATE (PF) 100 MCG/2ML IJ SOLN
INTRAMUSCULAR | Status: AC
  Administered 2021-08-04: 25 ug via INTRAVENOUS
  Filled 2021-08-04: qty 2

## 2021-08-04 MED ORDER — ACETAMINOPHEN 500 MG PO TABS
ORAL_TABLET | ORAL | Status: AC
  Administered 2021-08-04: 1000 mg via ORAL
  Filled 2021-08-04: qty 2

## 2021-08-04 MED ORDER — FENTANYL CITRATE (PF) 250 MCG/5ML IJ SOLN
INTRAMUSCULAR | Status: AC
  Filled 2021-08-04: qty 5

## 2021-08-04 MED ORDER — LACTATED RINGERS IV SOLN: INTRAVENOUS | Status: DC

## 2021-08-04 MED ORDER — FAMOTIDINE 20 MG PO TABS
ORAL_TABLET | ORAL | Status: AC
  Administered 2021-08-04: 20 mg via ORAL
  Filled 2021-08-04: qty 1

## 2021-08-04 MED ORDER — ROCURONIUM BROMIDE 100 MG/10ML IV SOLN
INTRAVENOUS | Status: DC | PRN
  Administered 2021-08-04 (×2): 10 mg via INTRAVENOUS
  Administered 2021-08-04: 60 mg via INTRAVENOUS

## 2021-08-04 MED ORDER — ROCURONIUM BROMIDE 10 MG/ML (PF) SYRINGE
PREFILLED_SYRINGE | INTRAVENOUS | Status: AC
  Filled 2021-08-04: qty 10

## 2021-08-04 MED ORDER — CHLORHEXIDINE GLUCONATE 0.12 % MT SOLN: 15.0000 mL | Freq: Once | OROMUCOSAL | Status: AC

## 2021-08-04 MED ORDER — ONDANSETRON HCL 4 MG/2ML IJ SOLN
INTRAMUSCULAR | Status: AC
  Filled 2021-08-04: qty 2

## 2021-08-04 MED ORDER — POVIDONE-IODINE 10 % EX SWAB
2.0000 "application " | Freq: Once | CUTANEOUS | Status: AC
  Administered 2021-08-04: 2 via TOPICAL

## 2021-08-04 MED ORDER — PROMETHAZINE HCL 25 MG/ML IJ SOLN: 6.2500 mg | INTRAMUSCULAR | Status: DC | PRN

## 2021-08-04 MED ORDER — LIDOCAINE HCL (PF) 2 % IJ SOLN
INTRAMUSCULAR | Status: AC
  Filled 2021-08-04: qty 5

## 2021-08-04 MED ORDER — KETOROLAC TROMETHAMINE 30 MG/ML IJ SOLN
INTRAMUSCULAR | Status: DC | PRN
  Administered 2021-08-04: 15 mg via INTRAVENOUS

## 2021-08-04 MED ORDER — ONDANSETRON HCL 4 MG/2ML IJ SOLN
INTRAMUSCULAR | Status: DC | PRN
  Administered 2021-08-04: 4 mg via INTRAVENOUS

## 2021-08-04 MED ORDER — MIDAZOLAM HCL 2 MG/2ML IJ SOLN
INTRAMUSCULAR | Status: DC | PRN
  Administered 2021-08-04: 2 mg via INTRAVENOUS

## 2021-08-04 MED ORDER — DEXAMETHASONE SODIUM PHOSPHATE 10 MG/ML IJ SOLN
INTRAMUSCULAR | Status: DC | PRN
  Administered 2021-08-04: 6 mg via INTRAVENOUS

## 2021-08-04 MED ORDER — HEMOSTATIC AGENTS (NO CHARGE) OPTIME
TOPICAL | Status: DC | PRN
Start: 2021-08-04 — End: 2021-08-04
  Administered 2021-08-04: 1 via TOPICAL

## 2021-08-04 MED ORDER — ONDANSETRON 4 MG PO TBDP: 4.0000 mg | ORAL_TABLET | Freq: Four times a day (QID) | ORAL | Status: DC | PRN

## 2021-08-04 MED ORDER — LIDOCAINE HCL (CARDIAC) PF 100 MG/5ML IV SOSY
PREFILLED_SYRINGE | INTRAVENOUS | Status: DC | PRN
  Administered 2021-08-04: 60 mg via INTRAVENOUS

## 2021-08-04 MED ORDER — ORAL CARE MOUTH RINSE: 15.0000 mL | Freq: Once | OROMUCOSAL | Status: AC

## 2021-08-04 MED ORDER — FENTANYL CITRATE (PF) 100 MCG/2ML IJ SOLN
25.0000 ug | INTRAMUSCULAR | Status: DC | PRN
  Administered 2021-08-04 (×2): 25 ug via INTRAVENOUS

## 2021-08-04 MED ORDER — SILVER NITRATE-POT NITRATE 75-25 % EX MISC
CUTANEOUS | Status: AC
  Filled 2021-08-04: qty 10

## 2021-08-04 MED ORDER — PROPOFOL 10 MG/ML IV BOLUS
INTRAVENOUS | Status: AC
  Filled 2021-08-04: qty 40

## 2021-08-04 MED ORDER — GABAPENTIN 300 MG PO CAPS
ORAL_CAPSULE | ORAL | Status: AC
  Administered 2021-08-04: 300 mg via ORAL
  Filled 2021-08-04: qty 1

## 2021-08-04 MED ORDER — CHLORHEXIDINE GLUCONATE 0.12 % MT SOLN
OROMUCOSAL | Status: AC
  Administered 2021-08-04: 15 mL via OROMUCOSAL
  Filled 2021-08-04: qty 15

## 2021-08-04 MED ORDER — BUPIVACAINE HCL (PF) 0.5 % IJ SOLN
INTRAMUSCULAR | Status: AC
  Filled 2021-08-04: qty 30

## 2021-08-04 MED ORDER — BUPIVACAINE HCL 0.5 % IJ SOLN
INTRAMUSCULAR | Status: DC | PRN
  Administered 2021-08-04: 30 mL

## 2021-08-04 MED ORDER — 0.9 % SODIUM CHLORIDE (POUR BTL) OPTIME
TOPICAL | Status: DC | PRN
  Administered 2021-08-04: 500 mL

## 2021-08-04 MED ORDER — SUGAMMADEX SODIUM 200 MG/2ML IV SOLN
INTRAVENOUS | Status: DC | PRN
  Administered 2021-08-04: 200 mg via INTRAVENOUS

## 2021-08-04 MED ORDER — CEFAZOLIN SODIUM-DEXTROSE 2-4 GM/100ML-% IV SOLN
INTRAVENOUS | Status: AC
  Filled 2021-08-04: qty 100

## 2021-08-04 MED ORDER — FLUORESCEIN SODIUM 10 % IV SOLN
INTRAVENOUS | Status: AC
  Filled 2021-08-04: qty 5

## 2021-08-04 MED ORDER — FENTANYL CITRATE (PF) 100 MCG/2ML IJ SOLN
INTRAMUSCULAR | Status: DC | PRN
  Administered 2021-08-04 (×3): 50 ug via INTRAVENOUS

## 2021-08-04 MED ORDER — FAMOTIDINE 20 MG PO TABS: 20.0000 mg | ORAL_TABLET | Freq: Once | ORAL | Status: AC

## 2021-08-04 MED ORDER — MIDAZOLAM HCL 2 MG/2ML IJ SOLN
INTRAMUSCULAR | Status: AC
  Filled 2021-08-04: qty 2

## 2021-08-04 MED ORDER — OXYCODONE-ACETAMINOPHEN 5-325 MG PO TABS
ORAL_TABLET | ORAL | Status: AC
  Administered 2021-08-04: 1 via ORAL
  Filled 2021-08-04: qty 1

## 2021-08-04 MED ORDER — POVIDONE-IODINE 10 % EX SOLN
CUTANEOUS | Status: DC | PRN
  Administered 2021-08-04: 1 via TOPICAL

## 2021-08-04 MED ORDER — PHENYLEPHRINE HCL (PRESSORS) 10 MG/ML IV SOLN
INTRAVENOUS | Status: AC
  Filled 2021-08-04: qty 1

## 2021-08-04 MED ORDER — CEFAZOLIN SODIUM-DEXTROSE 2-4 GM/100ML-% IV SOLN
2.0000 g | Freq: Once | INTRAVENOUS | Status: AC
  Administered 2021-08-04: 2 g via INTRAVENOUS

## 2021-08-04 MED ORDER — PROPOFOL 10 MG/ML IV BOLUS
INTRAVENOUS | Status: DC | PRN
  Administered 2021-08-04: 160 mg via INTRAVENOUS

## 2021-08-04 MED ORDER — GABAPENTIN 300 MG PO CAPS: 300.0000 mg | ORAL_CAPSULE | ORAL | Status: AC

## 2021-08-04 MED ORDER — ACETAMINOPHEN 500 MG PO TABS: 1000.0000 mg | ORAL_TABLET | ORAL | Status: AC

## 2021-08-04 MED ORDER — DEXAMETHASONE SODIUM PHOSPHATE 10 MG/ML IJ SOLN
INTRAMUSCULAR | Status: AC
  Filled 2021-08-04: qty 1

## 2021-08-04 MED ORDER — LACTATED RINGERS IR SOLN
Status: DC | PRN
  Administered 2021-08-04: 1000 mL

## 2021-08-04 MED ORDER — SODIUM CHLORIDE 0.9 % IR SOLN
Status: DC | PRN
  Administered 2021-08-04: 1000 mL via INTRAVESICAL

## 2021-08-04 MED ORDER — OXYCODONE-ACETAMINOPHEN 5-325 MG PO TABS: 1.0000 | ORAL_TABLET | ORAL | Status: DC | PRN

## 2021-08-04 SURGICAL SUPPLY — 56 items
APL PRP STRL LF DISP 70% ISPRP (MISCELLANEOUS) ×3
APL SRG 38 LTWT LNG FL B (MISCELLANEOUS) ×3
APL SWBSTK 6 STRL LF DISP (MISCELLANEOUS) ×3
APPLICATOR ARISTA FLEXITIP XL (MISCELLANEOUS) ×4 IMPLANT
APPLICATOR COTTON TIP 6 STRL (MISCELLANEOUS) ×3 IMPLANT
APPLICATOR COTTON TIP 6IN STRL (MISCELLANEOUS) ×4
BAG DRN RND TRDRP ANRFLXCHMBR (UROLOGICAL SUPPLIES) ×3
BAG URINE DRAIN 2000ML AR STRL (UROLOGICAL SUPPLIES) ×4 IMPLANT
BLADE SURG SZ11 CARB STEEL (BLADE) ×8 IMPLANT
CATH FOLEY 2WAY  5CC 16FR (CATHETERS) ×1
CATH FOLEY 2WAY 5CC 16FR (CATHETERS) ×3
CATH URTH 16FR FL 2W BLN LF (CATHETERS) ×3 IMPLANT
CHLORAPREP W/TINT 26 (MISCELLANEOUS) ×4 IMPLANT
DEFOGGER ANTIFOG KIT (MISCELLANEOUS) ×4 IMPLANT
DRSG TEGADERM 2-3/8X2-3/4 SM (GAUZE/BANDAGES/DRESSINGS) ×12 IMPLANT
GAUZE 4X4 16PLY ~~LOC~~+RFID DBL (SPONGE) ×8 IMPLANT
GLOVE SURG SYN 8.0 (GLOVE) ×4 IMPLANT
GOWN STRL REUS W/ TWL LRG LVL3 (GOWN DISPOSABLE) ×6 IMPLANT
GOWN STRL REUS W/ TWL XL LVL3 (GOWN DISPOSABLE) ×3 IMPLANT
GOWN STRL REUS W/TWL LRG LVL3 (GOWN DISPOSABLE) ×8
GOWN STRL REUS W/TWL XL LVL3 (GOWN DISPOSABLE) ×4
GRASPER SUT TROCAR 14GX15 (MISCELLANEOUS) ×4 IMPLANT
HEMOSTAT ARISTA ABSORB 3G PWDR (HEMOSTASIS) ×4 IMPLANT
IRRIGATION STRYKERFLOW (MISCELLANEOUS) ×3 IMPLANT
IRRIGATOR STRYKERFLOW (MISCELLANEOUS) ×4
IV LACTATED RINGERS 1000ML (IV SOLUTION) ×4 IMPLANT
KIT PINK PAD W/HEAD ARE REST (MISCELLANEOUS) ×4
KIT PINK PAD W/HEAD ARM REST (MISCELLANEOUS) ×3 IMPLANT
KIT TURNOVER CYSTO (KITS) ×4 IMPLANT
LABEL OR SOLS (LABEL) ×4 IMPLANT
MANIFOLD NEPTUNE II (INSTRUMENTS) ×4 IMPLANT
MORCELLATOR XCISE  COR (MISCELLANEOUS) ×1
MORCELLATOR XCISE COR (MISCELLANEOUS) ×3 IMPLANT
NS IRRIG 500ML POUR BTL (IV SOLUTION) ×4 IMPLANT
PACK GYN LAPAROSCOPIC (MISCELLANEOUS) ×4 IMPLANT
PAD OB MATERNITY 4.3X12.25 (PERSONAL CARE ITEMS) ×4 IMPLANT
PAD PREP 24X41 OB/GYN DISP (PERSONAL CARE ITEMS) ×4 IMPLANT
SCRUB EXIDINE 4% CHG 4OZ (MISCELLANEOUS) ×4 IMPLANT
SET CYSTO W/LG BORE CLAMP LF (SET/KITS/TRAYS/PACK) IMPLANT
SET TUBE SMOKE EVAC HIGH FLOW (TUBING) ×4 IMPLANT
SHEARS HARMONIC ACE PLUS 36CM (ENDOMECHANICALS) ×4 IMPLANT
SLEEVE ENDOPATH XCEL 5M (ENDOMECHANICALS) ×4 IMPLANT
SOL PREP PROV IODINE SCRUB 4OZ (MISCELLANEOUS) ×4 IMPLANT
SOLUTION ELECTROLUBE (MISCELLANEOUS) IMPLANT
SPONGE GAUZE 2X2 8PLY STRL LF (GAUZE/BANDAGES/DRESSINGS) ×8 IMPLANT
STRIP CLOSURE SKIN 1/2X4 (GAUZE/BANDAGES/DRESSINGS) ×4 IMPLANT
SUT VIC AB 2-0 UR6 27 (SUTURE) ×4 IMPLANT
SUT VIC AB 3-0 SH 27 (SUTURE) ×4
SUT VIC AB 3-0 SH 27X BRD (SUTURE) ×3 IMPLANT
SUT VIC AB 4-0 SH 27 (SUTURE) ×8
SUT VIC AB 4-0 SH 27XANBCTRL (SUTURE) ×6 IMPLANT
SYR 10ML LL (SYRINGE) ×4 IMPLANT
SYR 20ML LL LF (SYRINGE) ×4 IMPLANT
TROCAR ENDO BLADELESS 11MM (ENDOMECHANICALS) ×8 IMPLANT
TROCAR XCEL NON-BLD 5MMX100MML (ENDOMECHANICALS) ×4 IMPLANT
WATER STERILE IRR 500ML POUR (IV SOLUTION) IMPLANT

## 2021-08-04 NOTE — OR Nursing (Deleted)
Dr. Bary Castilla OK'd dc home

## 2021-08-04 NOTE — Transfer of Care (Signed)
Immediate Anesthesia Transfer of Care Note  Patient: Nicole Mcgee  Procedure(s) Performed: LAPAROSCOPIC SUPRACERVICAL HYSTERECTOMY (Pelvis) LAPAROSCOPIC BILATERAL SALPINGECTOMY (Bilateral: Pelvis) CYSTOSCOPY LAPAROSCOPIC LYSIS OF ADHESIONS, EXTENSIVE  Patient Location: PACU  Anesthesia Type:General  Level of Consciousness: awake, alert  and oriented  Airway & Oxygen Therapy: Patient Spontanous Breathing and Patient connected to face mask oxygen  Post-op Assessment: Report given to RN and Post -op Vital signs reviewed and stable  Post vital signs: Reviewed and stable  Last Vitals:  Vitals Value Taken Time  BP 119/94 08/04/21 1319  Temp 36.4 C 08/04/21 1319  Pulse 96 08/04/21 1324  Resp 17 08/04/21 1324  SpO2 100 % 08/04/21 1324  Vitals shown include unvalidated device data.  Last Pain:  Vitals:   08/04/21 1319  TempSrc:   PainSc: Asleep         Complications: No notable events documented.

## 2021-08-04 NOTE — Brief Op Note (Signed)
08/04/2021  1:11 PM  PATIENT:  Nicole Mcgee  45 y.o. female  PRE-OPERATIVE DIAGNOSIS:  menorrhagia  POST-OPERATIVE DIAGNOSIS:  menorrhagia Extensive abdomino-pelvic adhesions  PROCEDURE:  Procedure(s): LAPAROSCOPIC SUPRACERVICAL HYSTERECTOMY (N/A) LAPAROSCOPIC BILATERAL SALPINGECTOMY (Bilateral) CYSTOSCOPY LAPAROSCOPIC LYSIS OF ADHESIONS, EXTENSIVE  SURGEON:  Surgeon(s) and Role:    * Luismanuel Corman, Gwen Her, MD - Primary    * Benjaman Kindler, MD - Assisting  PHYSICIAN ASSISTANT:   ASSISTANTS: none   ANESTHESIA:   general  EBL:EBL 400 cc IOF 700 UO 500 cc   BLOOD ADMINISTERED:none  DRAINS: Urinary Catheter (Foley)   LOCAL MEDICATIONS USED:  MARCAINE     SPECIMEN:   uterus , fallopian tubes , bilat   DISPOSITION OF SPECIMEN:  PATHOLOGY  COUNTS:  YES  TOURNIQUET:  * No tourniquets in log *  DICTATION: .Other Dictation: Dictation Number verbal  PLAN OF CARE: Discharge to home after PACU  PATIENT DISPOSITION:  PACU - hemodynamically stable.   Delay start of Pharmacological VTE agent (>24hrs) due to surgical blood loss or risk of bleeding: not applicable

## 2021-08-04 NOTE — Anesthesia Procedure Notes (Signed)
Procedure Name: Intubation Date/Time: 08/04/2021 10:00 AM Performed by: Johney Maine, CRNA Pre-anesthesia Checklist: Patient identified, Patient being monitored, Timeout performed, Emergency Drugs available and Suction available Patient Re-evaluated:Patient Re-evaluated prior to induction Oxygen Delivery Method: Circle system utilized Preoxygenation: Pre-oxygenation with 100% oxygen Induction Type: IV induction Ventilation: Mask ventilation without difficulty Laryngoscope Size: Miller and 2 Grade View: Grade I Tube type: Oral Tube size: 7.0 mm Number of attempts: 1 Placement Confirmation: ETT inserted through vocal cords under direct vision, positive ETCO2 and breath sounds checked- equal and bilateral Secured at: 21 cm Tube secured with: Tape Dental Injury: Teeth and Oropharynx as per pre-operative assessment

## 2021-08-04 NOTE — Anesthesia Preprocedure Evaluation (Signed)
Anesthesia Evaluation  Patient identified by MRN, date of birth, ID band Patient awake    Reviewed: Allergy & Precautions, H&P , NPO status , Patient's Chart, lab work & pertinent test results, reviewed documented beta blocker date and time   History of Anesthesia Complications Negative for: history of anesthetic complications  Airway Mallampati: I  TM Distance: >3 FB Neck ROM: full    Dental  (+) Teeth Intact, Dental Advidsory Given   Pulmonary neg pulmonary ROS,    Pulmonary exam normal breath sounds clear to auscultation       Cardiovascular Exercise Tolerance: Good negative cardio ROS Normal cardiovascular exam Rhythm:regular Rate:Normal     Neuro/Psych negative neurological ROS  negative psych ROS   GI/Hepatic negative GI ROS, Neg liver ROS,   Endo/Other  neg diabetesMorbid obesity  Renal/GU negative Renal ROS  negative genitourinary   Musculoskeletal   Abdominal   Peds  Hematology negative hematology ROS (+)   Anesthesia Other Findings Past Medical History: 2022: COVID-19   Reproductive/Obstetrics negative OB ROS                             Anesthesia Physical Anesthesia Plan  ASA: 3  Anesthesia Plan: General   Post-op Pain Management:    Induction: Intravenous  PONV Risk Score and Plan: 3 and Ondansetron, Dexamethasone, Midazolam and Treatment may vary due to age or medical condition  Airway Management Planned: Oral ETT  Additional Equipment:   Intra-op Plan:   Post-operative Plan: Extubation in OR  Informed Consent: I have reviewed the patients History and Physical, chart, labs and discussed the procedure including the risks, benefits and alternatives for the proposed anesthesia with the patient or authorized representative who has indicated his/her understanding and acceptance.     Dental Advisory Given  Plan Discussed with: Anesthesiologist, CRNA and  Surgeon  Anesthesia Plan Comments:         Anesthesia Quick Evaluation

## 2021-08-04 NOTE — Op Note (Signed)
NAME: Nicole Mcgee, CROSLIN MEDICAL RECORD NO: [id number] ACCOUNT NO: [id number] DATE OF BIRTH: August 03, 1976 FACILITY: [facility] LOCATION: [facility] PHYSICIAN: Boykin Nearing, MD  Operative Report   DATE OF PROCEDURE: 08/04/2021  PREOPERATIVE DIAGNOSIS:  Menorrhagia, unresponsive to conservative therapy.  POSTOPERATIVE DIAGNOSES:  1.  Menorrhagia. 2.  Extensive abdominopelvic adhesions.  PROCEDURE: 1.  Laparoscopic supracervical hysterectomy. 2.  Bilateral salpingectomy. 3.  Extensive abdominal and pelvic adhesiolysis incorporating greater than 60% of total operating time.  SURGEON:  Laverta Baltimore, MD  FIRST ASSISTANTLeafy Ro  ANESTHESIA:  General endotracheal anesthesia.  INDICATIONS:  A 45 year old gravida 2, para 2, the patient is status post cesarean section x2 and has a long history of heavy menstrual cycles with large clots and heavy bleeding.  The patient has undergone a dilation and curettage in the past for this  and has tried birth control pills, which have not resolved her heavy bleeding.  DESCRIPTION OF PROCEDURE:  After adequate general endotracheal anesthesia, the patient was placed in dorsal supine position with the legs in the Kibler stirrups.  The patient's abdomen, perineum and vagina were prepped and draped in normal sterile  fashion.  Timeout was performed.  The patient did receive 2 grams IV Ancef prior to commencement of the case for surgical prophylaxis.  Speculum was placed in the vagina and the anterior cervix was grasped with a single tooth tenaculum and a uterine  sound was placed partially into the cervix and was tethered together with the tenaculum to be used for uterine manipulation during the procedure.  Foley catheter was placed.  Gloves and gown changed and attention was directed to the patient's abdomen  where a 5 mm infraumbilical incision was made after injecting with 0.5% Marcaine.  A 5 mm laparoscope was advanced into the abdominal cavity  under direct visualization with the Optiview cannula.  Second port placement was placed in the left lower  quadrant, 3 cm medial to the left anterior iliac spine and a #11 trocar was advanced under direct visualization.  A third port was placed on the right lower quadrant, again 3 cm medial to the right anterior iliac spine.  An 11 mm trocar was advanced  under direct visualization.  Initial impression revealed multiple omental adhesions and dense uterine adhesions to the anterior abdominal wall.  The Harmonic scalpel was brought up to the operative field and extensive adhesiolysis took place for the next  90 minutes, incorporating approximately 60% of total operating time.  Dense adhesions on the anterior abdominal wall were taken down from the uterus as well as the omental adhesions that were noted.  The left fallopian tube was grasped at the distal  portion and salpingectomy was performed with removal of the left distal fallopian tube, which will be sent with pathologic specimen.  The uteroovarian ligament was then cauterized and transected and meticulous dissection in the broad ligament to the  level of the uterosacral ligaments occurred.  Uterine arteries were cauterized and transected.  A similar procedure was repeated on the patient's right fallopian tube, and after dissecting and removal of the right fallopian tube, the uteroovarian  ligament was transected and the broad ligament was then opened and the right uterine artery was cauterized and transected.  At the level of the uterosacral ligaments, the cervix was transected with Harmonic scalpel.  Once the uterus was amputated, it was  placed cephalad and the uterine sound was removed.  A vaginal hand was placed to allow for cauterization of the cervical stump with  the Kleppinger cautery.  Gloves and gown were changed yet again and a morcellator was placed through the left lower  quadrant port site and morcellation of the uterus ensued.  No remnants  of myometrium were identified at the end of the case.  The abdomen was copiously irrigated and suctioned and the pedicles appeared normal.  Arista was placed on cecal adhesions that  had slow oozing and Arista was also placed on the left ovarian pedicle that had minimal amount of oozing.  Good hemostasis resulted after placement of the Arista.  The patient's abdomen was then deflated after lowering the pressure to 7 mmHg and ensuring  good hemostasis.  Pictures were taken.  The two lower port sites were closed with the Carter-Thomason cone apparatus to close the fascia and all skin incisions were closed with interrupted 4-0 Vicryl suture.  Given the amount of dissection and the  difficulty of dissection, a cystoscopy was performed at the end of the case, which revealed a normal bladder and normal efflux of urine from each ureteral orifice.  The bladder was then drained.  The single tooth tenaculum was removed from the anterior  cervix and the patient was given 30 mg intravenous Toradol for pain relief.  ESTIMATED BLOOD LOSS:  400 mL.  INTRAOPERATIVE FLUIDS:  700 mL.  URINE OUTPUT:  500 mL.  There were no complications.  The patient tolerated the procedure well and was taken to recovery room in good condition.   SHW D: 08/04/2021 1:50:47 pm T: 08/04/2021 11:07:00 pm  JOB: [id number]

## 2021-08-04 NOTE — Progress Notes (Signed)
Pt is ready for [facility] and BS . NPO . LAbs reviewed . All questions answered . Proceed

## 2021-08-04 NOTE — Discharge Instructions (Addendum)
Keep dressings on for 5 days , bandage after for 3-5 days  AMBULATORY SURGERY  DISCHARGE INSTRUCTIONS   The drugs that you were given will stay in your system until tomorrow so for the next 24 hours you should not:  Drive an automobile Make any legal decisions Drink any alcoholic beverage   You may resume regular meals tomorrow.  Today it is better to start with liquids and gradually work up to solid foods.  You may eat anything you prefer, but it is better to start with liquids, then soup and crackers, and gradually work up to solid foods.   Please notify your doctor immediately if you have any unusual bleeding, trouble breathing, redness and pain at the surgery site, drainage, fever, or pain not relieved by medication.

## 2021-08-05 ENCOUNTER — Encounter: Payer: Self-pay | Admitting: Obstetrics and Gynecology

## 2021-08-07 LAB — SURGICAL PATHOLOGY

## 2021-08-15 NOTE — Anesthesia Postprocedure Evaluation (Signed)
Anesthesia Post Note  Patient: Nicole Mcgee  Procedure(s) Performed: LAPAROSCOPIC SUPRACERVICAL HYSTERECTOMY (Pelvis) LAPAROSCOPIC BILATERAL SALPINGECTOMY (Bilateral: Pelvis) CYSTOSCOPY LAPAROSCOPIC LYSIS OF ADHESIONS, EXTENSIVE  Patient location during evaluation: PACU Anesthesia Type: General Level of consciousness: awake and alert Pain management: pain level controlled Vital Signs Assessment: post-procedure vital signs reviewed and stable Respiratory status: spontaneous breathing, nonlabored ventilation, respiratory function stable and patient connected to nasal cannula oxygen Cardiovascular status: blood pressure returned to baseline and stable Postop Assessment: no apparent nausea or vomiting Anesthetic complications: no   No notable events documented.   Last Vitals:  Vitals:   08/04/21 1429 08/04/21 1457  BP: (!) 128/98 (!) 160/88  Pulse: 85 85  Resp: 16 18  Temp: (!) 36.2 C (!) 36.3 C  SpO2: 99% 100%    Last Pain:  Vitals:   08/04/21 1457  TempSrc: Temporal  PainSc:                  Molli Barrows

## 2021-09-04 ENCOUNTER — Other Ambulatory Visit: Payer: Self-pay

## 2021-09-04 DIAGNOSIS — L509 Urticaria, unspecified: Secondary | ICD-10-CM | POA: Insufficient documentation

## 2021-09-04 DIAGNOSIS — Z5321 Procedure and treatment not carried out due to patient leaving prior to being seen by health care provider: Secondary | ICD-10-CM | POA: Diagnosis not present

## 2021-09-04 NOTE — ED Triage Notes (Signed)
Pt in with co rash and hives that started a few days ago. States has changed locations since started, now worse to posterior thighs and bilat arms. No shob noted at this time. Pt denies any new meds or exposures.

## 2021-09-05 ENCOUNTER — Emergency Department
Admission: EM | Admit: 2021-09-05 | Discharge: 2021-09-05 | Disposition: A | Discharge status: Home / Self Care | Payer: BC Managed Care – PPO | Attending: Emergency Medicine | Admitting: Emergency Medicine

## 2021-09-15 ENCOUNTER — Other Ambulatory Visit: Payer: Self-pay

## 2021-09-15 ENCOUNTER — Emergency Department
Admission: EM | Admit: 2021-09-15 | Discharge: 2021-09-15 | Disposition: A | Discharge status: Home / Self Care | Payer: BC Managed Care – PPO | Attending: Emergency Medicine | Admitting: Emergency Medicine

## 2021-09-15 DIAGNOSIS — R21 Rash and other nonspecific skin eruption: Secondary | ICD-10-CM | POA: Insufficient documentation

## 2021-09-15 DIAGNOSIS — Z5321 Procedure and treatment not carried out due to patient leaving prior to being seen by health care provider: Secondary | ICD-10-CM | POA: Insufficient documentation

## 2021-09-15 NOTE — ED Notes (Signed)
No answer when called several times from lobby 

## 2021-09-15 NOTE — ED Triage Notes (Signed)
Pt in with co rash throughout for a week. States unknown cause, and has not been seen for the same.

## 2022-09-04 ENCOUNTER — Emergency Department
Admission: EM | Admit: 2022-09-04 | Discharge: 2022-09-04 | Discharge status: Against Medical Advice | Payer: BC Managed Care – PPO | Attending: Emergency Medicine | Admitting: Emergency Medicine

## 2022-09-04 ENCOUNTER — Other Ambulatory Visit: Payer: Self-pay

## 2022-09-04 DIAGNOSIS — Z5321 Procedure and treatment not carried out due to patient leaving prior to being seen by health care provider: Secondary | ICD-10-CM | POA: Diagnosis not present

## 2022-09-04 DIAGNOSIS — R339 Retention of urine, unspecified: Secondary | ICD-10-CM | POA: Diagnosis present

## 2022-09-04 DIAGNOSIS — R350 Frequency of micturition: Secondary | ICD-10-CM | POA: Diagnosis not present

## 2022-09-04 DIAGNOSIS — R109 Unspecified abdominal pain: Secondary | ICD-10-CM | POA: Insufficient documentation

## 2022-09-04 LAB — BASIC METABOLIC PANEL
Anion gap: 6 (ref 5–15)
BUN: 11 mg/dL (ref 6–20)
CO2: 28 mmol/L (ref 22–32)
Calcium: 9 mg/dL (ref 8.9–10.3)
Chloride: 106 mmol/L (ref 98–111)
Creatinine, Ser: 0.96 mg/dL (ref 0.44–1.00)
GFR, Estimated: >60 mL/min (ref >60)
Glucose, Bld: 118 mg/dL — ABNORMAL HIGH (ref 70–99)
Potassium: 3.5 mmol/L (ref 3.5–5.1)
Sodium: 140 mmol/L (ref 135–145)

## 2022-09-04 LAB — CBC
HCT: 43 % (ref 36.0–46.0)
Hemoglobin: 14.2 g/dL (ref 12.0–15.0)
MCH: 30.2 pg (ref 26.0–34.0)
MCHC: 33 g/dL (ref 30.0–36.0)
MCV: 91.5 fL (ref 80.0–100.0)
Platelets: 320 10*3/uL (ref 150–400)
RBC: 4.7 MIL/uL (ref 3.87–5.11)
RDW: 12.3 % (ref 11.5–15.5)
WBC: 10.7 10*3/uL — ABNORMAL HIGH (ref 4.0–10.5)
nRBC: 0 % (ref 0.0–0.2)

## 2022-09-04 LAB — URINALYSIS, ROUTINE W REFLEX MICROSCOPIC
Bacteria, UA: NONE SEEN
Bilirubin Urine: NEGATIVE
Glucose, UA: NEGATIVE mg/dL
Ketones, ur: NEGATIVE mg/dL
Nitrite: NEGATIVE
Protein, ur: 100 mg/dL — AB
RBC / HPF: >50 RBC/hpf — ABNORMAL HIGH (ref 0–5)
Specific Gravity, Urine: 1.026 (ref 1.005–1.030)
WBC, UA: >50 WBC/hpf — ABNORMAL HIGH (ref 0–5)
pH: 6 (ref 5.0–8.0)

## 2022-09-04 NOTE — ED Triage Notes (Signed)
Pt to ED via POV from home. Pt reports urinary retention. Pt states increased urge/frequency to urinate but little output. Pt reports hx UTI and feels the same. Pt reports abdominal pain when she tries to urinate.

## 2022-09-05 ENCOUNTER — Other Ambulatory Visit: Payer: Self-pay

## 2022-09-05 ENCOUNTER — Emergency Department
Admission: EM | Admit: 2022-09-05 | Discharge: 2022-09-05 | Disposition: A | Discharge status: Home / Self Care | Payer: BC Managed Care – PPO | Attending: Emergency Medicine | Admitting: Emergency Medicine

## 2022-09-05 DIAGNOSIS — N3001 Acute cystitis with hematuria: Secondary | ICD-10-CM | POA: Diagnosis not present

## 2022-09-05 DIAGNOSIS — R319 Hematuria, unspecified: Secondary | ICD-10-CM | POA: Diagnosis present

## 2022-09-05 MED ORDER — CEPHALEXIN 500 MG PO CAPS: 500.0000 mg | ORAL_CAPSULE | Freq: Two times a day (BID) | ORAL | 0 refills | Status: AC

## 2022-09-05 NOTE — ED Triage Notes (Signed)
Pt. To ED via POV from home for blood in urine and pain at end of stream since Sunday night. Pt. LWBS last night.

## 2022-09-05 NOTE — ED Provider Notes (Signed)
   [facility] Provider Note    Event Date/Time   First MD Initiated Contact with Patient 09/05/22 [phone]     (approximate)   History   Hematuria  HPI  Nicole Mcgee is a 46 y.o. female who presents with complaints of dysuria over the last 2 to 3 days.  Today she noticed a small amount of hematuria.  Denies back pain.  No flank pain.  No fevers chills nausea or vomiting.     Physical Exam   Triage Vital Signs: ED Triage Vitals  Enc Vitals Group     BP 09/05/22 0941 (!) 150/87     Pulse Rate 09/05/22 0941 75     Resp 09/05/22 0941 18     Temp 09/05/22 0941 98.2 F (36.8 C)     Temp Source 09/05/22 0941 Oral     SpO2 09/05/22 0941 97 %     Weight 09/05/22 0943 99.3 kg (219 lb)     Height 09/05/22 0943 1.575 m ('5\' 2"'$ )     Head Circumference --      Peak Flow --      Pain Score 09/05/22 0942 0     Pain Loc --      Pain Edu? --      Excl. in [place]? --     Most recent vital signs: Vitals:   09/05/22 0941  BP: (!) 150/87  Pulse: 75  Resp: 18  Temp: 98.2 F (36.8 C)  SpO2: 97%     General: Awake, no distress.  CV:  Good peripheral perfusion.  Resp:  Normal effort.  Abd:  No distention.  No CVA tenderness Other:     ED Results / Procedures / Treatments   Labs (all labs ordered are listed, but only abnormal results are displayed) Labs Reviewed - No data to display   EKG     RADIOLOGY     PROCEDURES:  Critical Care performed:   Procedures   MEDICATIONS ORDERED IN ED: Medications - No data to display   IMPRESSION / MDM / [place] / ED COURSE  I reviewed the triage vital signs and the nursing notes. Patient's presentation is most consistent with acute complicated illness / injury requiring diagnostic workup.  Patient presents with dysuria as noted above, no unilateral flank or back pain, well-appearing, nontoxic.  Reviewed urinalysis and lab work performed last night, consistent with UTI.  We will start  the patient on Keflex Rx, return precautions discussed if any fevers worsening pain nausea vomiting        FINAL CLINICAL IMPRESSION(S) / ED DIAGNOSES   Final diagnoses:  Acute cystitis with hematuria     Rx / DC Orders   ED Discharge Orders          Ordered    cephALEXin (KEFLEX) 500 MG capsule  2 times daily        09/05/22 0957             Note:  This document was prepared using Dragon voice recognition software and may include unintentional dictation errors.   Lavonia Drafts, MD 09/05/22 1018

## 2022-09-05 NOTE — ED Notes (Signed)
Pt. LWBS yesterday, was protocol yesterday, blood and urine done last night.

## 2023-07-03 ENCOUNTER — Encounter: Payer: Self-pay | Admitting: Emergency Medicine

## 2023-07-03 ENCOUNTER — Emergency Department
Admission: EM | Admit: 2023-07-03 | Discharge: 2023-07-03 | Disposition: A | Discharge status: Home / Self Care | Payer: Self-pay | Attending: Emergency Medicine | Admitting: Emergency Medicine

## 2023-07-03 ENCOUNTER — Other Ambulatory Visit: Payer: Self-pay

## 2023-07-03 ENCOUNTER — Emergency Department: Payer: Self-pay

## 2023-07-03 DIAGNOSIS — I10 Essential (primary) hypertension: Secondary | ICD-10-CM | POA: Insufficient documentation

## 2023-07-03 LAB — TROPONIN I (HIGH SENSITIVITY): Troponin I (High Sensitivity): 4 ng/L (ref <18)

## 2023-07-03 LAB — CBC
HCT: 42.9 % (ref 36.0–46.0)
Hemoglobin: 14.4 g/dL (ref 12.0–15.0)
MCH: 31 pg (ref 26.0–34.0)
MCHC: 33.6 g/dL (ref 30.0–36.0)
MCV: 92.5 fL (ref 80.0–100.0)
Platelets: 293 10*3/uL (ref 150–400)
RBC: 4.64 MIL/uL (ref 3.87–5.11)
RDW: 11.9 % (ref 11.5–15.5)
WBC: 7.1 10*3/uL (ref 4.0–10.5)
nRBC: 0 % (ref 0.0–0.2)

## 2023-07-03 LAB — BASIC METABOLIC PANEL
Anion gap: 9 (ref 5–15)
BUN: 14 mg/dL (ref 6–20)
CO2: 23 mmol/L (ref 22–32)
Calcium: 9.1 mg/dL (ref 8.9–10.3)
Chloride: 110 mmol/L (ref 98–111)
Creatinine, Ser: 0.85 mg/dL (ref 0.44–1.00)
GFR, Estimated: >60 mL/min (ref >60)
Glucose, Bld: 95 mg/dL (ref 70–99)
Potassium: 4 mmol/L (ref 3.5–5.1)
Sodium: 142 mmol/L (ref 135–145)

## 2023-07-03 NOTE — ED Triage Notes (Addendum)
Pt via POV from home. Pt c/o headache for the past couple of days. States that she checked her BP at work it was 170/110. Denies hx of HTN and never been on medication. States she is also feel lightheaded. Pt is A&Ox4 and NAD

## 2023-07-03 NOTE — Discharge Instructions (Addendum)
Your blood work, EKG and chest x-ray were normal today.  I will place a referral for primary care provider.  It is very important that you follow-up with them so that they can start you on a blood pressure medication if needed.  Please get a blood pressure cuff from the pharmacy or grocery store and check your blood pressure at the same time every day and write these numbers down.  Bring these numbers to your primary care provider, this will be very helpful to them.  Please return to the ED if you develop any chest pain, shortness of breath, headache that won't go away, blurry vision or nosebleed in the context of elevated BP.   I have attached some information about elevated blood pressure.

## 2023-07-03 NOTE — ED Provider Notes (Signed)
[facility] Provider Note    Event Date/Time   First MD Initiated Contact with Patient 07/03/23 1141     (approximate)   History   Hypertension   HPI  Nicole Mcgee is a 47 y.o. female  with no PMH presents for evaluation of elevated BP.  Patient has felt lightheaded and has had a headache since Monday which prompted her to check her blood pressure.  She checked it at work and it was 170/110.  Patient denies a history of hypertension and has not taken medication for this.  She also endorses intermittent blurry vision and chest heaviness.  She denies epistaxis.   Patient states she does not have a PCP and has not been to the doctor in over a year.      Physical Exam   Triage Vital Signs: ED Triage Vitals  Encounter Vitals Group     BP 07/03/23 1027 (!) 163/107     Systolic BP Percentile --      Diastolic BP Percentile --      Pulse Rate 07/03/23 1027 83     Resp 07/03/23 1027 18     Temp 07/03/23 1027 98.3 F (36.8 C)     Temp Source 07/03/23 1027 Oral     SpO2 07/03/23 1027 97 %     Weight 07/03/23 1025 222 lb (100.7 kg)     Height 07/03/23 1025 5\' 2"  (1.575 m)     Head Circumference --      Peak Flow --      Pain Score 07/03/23 1025 6     Pain Loc --      Pain Education --      Exclude from Growth Chart --     Most recent vital signs: Vitals:   07/03/23 1027  BP: (!) 163/107  Pulse: 83  Resp: 18  Temp: 98.3 F (36.8 C)  SpO2: 97%     General: Awake, no distress.  CV:  Good peripheral perfusion. RRR. Resp:  Normal effort. CTAB. Abd:  No distention.     ED Results / Procedures / Treatments   Labs (all labs ordered are listed, but only abnormal results are displayed) Labs Reviewed  CBC  BASIC METABOLIC PANEL  TROPONIN I (HIGH SENSITIVITY)     EKG  EKG provider interpretation: NSR with no ST changes, low voltage EKG. Vent. rate 79 BPM PR interval 162 ms QRS duration 68 ms QT/QTcB 364/417  ms   RADIOLOGY  Chest x-ray obtained, interpreted the images as well as reviewed the radiologist report which shows no acute cardiopulmonary abnormalities.   PROCEDURES:  Critical Care performed: No  Procedures   MEDICATIONS ORDERED IN ED: Medications - No data to display   IMPRESSION / MDM / ASSESSMENT AND PLAN / ED COURSE  I reviewed the triage vital signs and the nursing notes.                             47 year old female with no history of HTN presents for evaluation of HTN.  BP elevated in triage, patient NAD on exam.  Differential diagnosis includes, but is not limited to, elevated BP, hypertensive urgency, hypertensive emergency.  Patient's presentation is most consistent with acute complicated illness / injury requiring diagnostic workup.  CBC and BMP WNL.  Troponin not elevated.  EKG shows NSR with no ST changes.  Chest x-ray obtained, interpreted the images as well as reviewed the  radiologist report, which was negative for acute cardiopulmonary abnormalities.  Given patient's negative workup, she stable for outpatient management.  Since patient does not have a PCP, I will place a referral for her.  I advised her to get a BP cuff and take her BP twice a day at the same time for a week and then bring these numbers to her PCP.  I explained to the patient that I would not be starting her on BP medication today as I would not be able to have appropriate follow-up. She was given strict return precautions.  Patient voiced understanding, all questions were answered and she was stable at discharge.      FINAL CLINICAL IMPRESSION(S) / ED DIAGNOSES   Final diagnoses:  Hypertension, unspecified type     Rx / DC Orders   ED Discharge Orders          Ordered    Ambulatory Referral to Primary Care (Establish Care)       Comments: Needs blood pressure management.   07/03/23 1414             Note:  This document was prepared using Dragon voice recognition  software and may include unintentional dictation errors.   Cameron Ali, PA-C 07/03/23 1415    Janith Lima, MD 07/03/23 [phone]
